# Patient Record
Sex: Female | Born: 1975 | Race: Black or African American | Hispanic: No | Marital: Married | Smoking: Never smoker
Health system: Southern US, Community
[De-identification: ages and names within clinical notes are randomized; demographics above are authoritative.]

## PROBLEM LIST (undated history)

## (undated) DIAGNOSIS — Z973 Presence of spectacles and contact lenses: Secondary | ICD-10-CM

## (undated) DIAGNOSIS — N3941 Urge incontinence: Secondary | ICD-10-CM

## (undated) DIAGNOSIS — K219 Gastro-esophageal reflux disease without esophagitis: Secondary | ICD-10-CM

## (undated) DIAGNOSIS — N920 Excessive and frequent menstruation with regular cycle: Secondary | ICD-10-CM

## (undated) DIAGNOSIS — G935 Compression of brain: Secondary | ICD-10-CM

## (undated) DIAGNOSIS — E785 Hyperlipidemia, unspecified: Secondary | ICD-10-CM

## (undated) DIAGNOSIS — M1990 Unspecified osteoarthritis, unspecified site: Secondary | ICD-10-CM

## (undated) DIAGNOSIS — E039 Hypothyroidism, unspecified: Secondary | ICD-10-CM

## (undated) DIAGNOSIS — N943 Premenstrual tension syndrome: Secondary | ICD-10-CM

## (undated) DIAGNOSIS — E669 Obesity, unspecified: Secondary | ICD-10-CM

## (undated) DIAGNOSIS — E282 Polycystic ovarian syndrome: Secondary | ICD-10-CM

## (undated) DIAGNOSIS — E559 Vitamin D deficiency, unspecified: Secondary | ICD-10-CM

## (undated) DIAGNOSIS — Z862 Personal history of diseases of the blood and blood-forming organs and certain disorders involving the immune mechanism: Secondary | ICD-10-CM

## (undated) DIAGNOSIS — G43909 Migraine, unspecified, not intractable, without status migrainosus: Secondary | ICD-10-CM

## (undated) DIAGNOSIS — L309 Dermatitis, unspecified: Secondary | ICD-10-CM

## (undated) DIAGNOSIS — F419 Anxiety disorder, unspecified: Secondary | ICD-10-CM

## (undated) HISTORY — PX: WISDOM TOOTH EXTRACTION: SHX21

## (undated) HISTORY — DX: Anxiety disorder, unspecified: F41.9

## (undated) HISTORY — DX: Gastro-esophageal reflux disease without esophagitis: K21.9

## (undated) HISTORY — DX: Unspecified osteoarthritis, unspecified site: M19.90

## (undated) HISTORY — DX: Premenstrual tension syndrome: N94.3

---

## 2001-02-20 ENCOUNTER — Ambulatory Visit (HOSPITAL_COMMUNITY): Admission: RE | Admit: 2001-02-20 | Discharge: 2001-02-20 | Payer: Self-pay | Admitting: Internal Medicine

## 2001-07-04 HISTORY — PX: TUBAL LIGATION: SHX77

## 2004-11-04 ENCOUNTER — Ambulatory Visit: Payer: Self-pay | Admitting: Family Medicine

## 2004-11-23 ENCOUNTER — Ambulatory Visit: Payer: Self-pay | Admitting: Family Medicine

## 2004-11-23 ENCOUNTER — Ambulatory Visit (HOSPITAL_COMMUNITY): Admission: RE | Admit: 2004-11-23 | Discharge: 2004-11-23 | Payer: Self-pay | Admitting: Family Medicine

## 2004-12-03 ENCOUNTER — Emergency Department (HOSPITAL_COMMUNITY): Admission: EM | Admit: 2004-12-03 | Discharge: 2004-12-03 | Payer: Self-pay | Admitting: Emergency Medicine

## 2004-12-07 ENCOUNTER — Ambulatory Visit (HOSPITAL_COMMUNITY): Admission: RE | Admit: 2004-12-07 | Discharge: 2004-12-07 | Payer: Self-pay | Admitting: Neurology

## 2005-07-27 ENCOUNTER — Ambulatory Visit: Payer: Self-pay | Admitting: Family Medicine

## 2005-08-01 ENCOUNTER — Ambulatory Visit: Payer: Self-pay | Admitting: Family Medicine

## 2005-08-02 ENCOUNTER — Ambulatory Visit (HOSPITAL_COMMUNITY): Admission: RE | Admit: 2005-08-02 | Discharge: 2005-08-02 | Payer: Self-pay | Admitting: Family Medicine

## 2005-08-29 ENCOUNTER — Ambulatory Visit: Payer: Self-pay | Admitting: Family Medicine

## 2005-09-27 ENCOUNTER — Other Ambulatory Visit: Admission: RE | Admit: 2005-09-27 | Discharge: 2005-09-27 | Payer: Self-pay | Admitting: Obstetrics and Gynecology

## 2006-02-08 ENCOUNTER — Ambulatory Visit: Payer: Self-pay | Admitting: Family Medicine

## 2006-10-03 ENCOUNTER — Ambulatory Visit: Payer: Self-pay | Admitting: Family Medicine

## 2007-04-20 ENCOUNTER — Emergency Department (HOSPITAL_COMMUNITY): Admission: EM | Admit: 2007-04-20 | Discharge: 2007-04-20 | Payer: Self-pay | Admitting: Emergency Medicine

## 2007-10-24 ENCOUNTER — Ambulatory Visit: Payer: Self-pay | Admitting: Family Medicine

## 2007-10-24 LAB — CONVERTED CEMR LAB
BUN: 9 mg/dL (ref 6–23)
Basophils Absolute: 0 10*3/uL (ref 0.0–0.1)
Basophils Relative: 1 % (ref 0–1)
CO2: 23 meq/L (ref 19–32)
Creatinine, Ser: 0.78 mg/dL (ref 0.40–1.20)
Eosinophils Absolute: 0.3 10*3/uL (ref 0.0–0.7)
HDL: 49 mg/dL (ref >39)
LDL Cholesterol: 106 mg/dL — ABNORMAL HIGH (ref 0–99)
Lymphocytes Relative: 37 % (ref 12–46)
Lymphs Abs: 2.1 10*3/uL (ref 0.7–4.0)
MCV: 95.8 fL (ref 78.0–100.0)
Monocytes Absolute: 0.5 10*3/uL (ref 0.1–1.0)
Monocytes Relative: 9 % (ref 3–12)
Neutrophils Relative %: 48 % (ref 43–77)
Potassium: 4.1 meq/L (ref 3.5–5.3)
RBC: 4 M/uL (ref 3.87–5.11)
RDW: 13 % (ref 11.5–15.5)
VLDL: 11 mg/dL (ref 0–40)

## 2007-10-31 DIAGNOSIS — G43909 Migraine, unspecified, not intractable, without status migrainosus: Secondary | ICD-10-CM | POA: Insufficient documentation

## 2008-09-18 ENCOUNTER — Ambulatory Visit: Payer: Self-pay | Admitting: Family Medicine

## 2008-09-18 ENCOUNTER — Telehealth: Payer: Self-pay | Admitting: Family Medicine

## 2008-09-18 DIAGNOSIS — R110 Nausea: Secondary | ICD-10-CM

## 2008-10-08 ENCOUNTER — Encounter: Payer: Self-pay | Admitting: Family Medicine

## 2008-11-26 ENCOUNTER — Encounter: Payer: Self-pay | Admitting: Family Medicine

## 2009-01-02 ENCOUNTER — Encounter: Payer: Self-pay | Admitting: Family Medicine

## 2009-04-20 ENCOUNTER — Telehealth: Payer: Self-pay | Admitting: Family Medicine

## 2009-04-20 ENCOUNTER — Emergency Department (HOSPITAL_COMMUNITY): Admission: EM | Admit: 2009-04-20 | Discharge: 2009-04-20 | Payer: Self-pay | Admitting: Emergency Medicine

## 2009-04-21 ENCOUNTER — Ambulatory Visit: Payer: Self-pay | Admitting: Family Medicine

## 2009-04-21 DIAGNOSIS — R209 Unspecified disturbances of skin sensation: Secondary | ICD-10-CM

## 2009-04-28 ENCOUNTER — Telehealth (INDEPENDENT_AMBULATORY_CARE_PROVIDER_SITE_OTHER): Payer: Self-pay | Admitting: *Deleted

## 2009-06-11 ENCOUNTER — Encounter: Payer: Self-pay | Admitting: Family Medicine

## 2009-06-23 ENCOUNTER — Ambulatory Visit (HOSPITAL_COMMUNITY): Admission: RE | Admit: 2009-06-23 | Discharge: 2009-06-23 | Payer: Self-pay | Admitting: Neurology

## 2009-09-15 ENCOUNTER — Encounter: Payer: Self-pay | Admitting: Family Medicine

## 2009-10-01 ENCOUNTER — Telehealth: Payer: Self-pay | Admitting: Family Medicine

## 2010-08-03 NOTE — Progress Notes (Signed)
Summary: [facility]   Imported By: Lind Guest 09/15/2009 10:18:19  _____________________________________________________________________  External Attachment:    Type:   Image     Comment:   External Document

## 2010-08-03 NOTE — Progress Notes (Signed)
Summary: rx  Phone Note Call from Patient   Summary of Call: would like to get a rx called into [facility] for congestion. [phone] work [phone] ext [phone] Initial call taken by: Rudene Anda,  October 01, 2009 3:08 PM  Follow-up for Phone Call        nasal congestion, sneezing, stopped up, cough no fever, no green drainage, x 3 days states mucinex not helping advised appt tomorrow but patient declined Follow-up by: Adella Hare LPN,  October 01, 2009 3:21 PM  Additional Follow-up for Phone Call Additional follow up Details #1::        suggest otc ocean spray, use of claritin or zyrtec also,  Additional Follow-up by: Syliva Overman MD,  October 01, 2009 5:09 PM    Additional Follow-up for Phone Call Additional follow up Details #2::    patient aware Follow-up by: Adella Hare LPN,  October 01, 2009 5:27 PM

## 2016-10-05 ENCOUNTER — Ambulatory Visit: Payer: Self-pay | Admitting: Family Medicine

## 2016-10-11 ENCOUNTER — Encounter: Payer: Self-pay | Admitting: Family Medicine

## 2016-10-11 ENCOUNTER — Ambulatory Visit (INDEPENDENT_AMBULATORY_CARE_PROVIDER_SITE_OTHER): Payer: Self-pay | Admitting: Family Medicine

## 2016-10-11 VITALS — BP 112/68 | HR 76 | Temp 98.2°F | Resp 16 | Ht 61.0 in | Wt 230.0 lb

## 2016-10-11 DIAGNOSIS — L309 Dermatitis, unspecified: Secondary | ICD-10-CM

## 2016-10-11 DIAGNOSIS — R55 Syncope and collapse: Secondary | ICD-10-CM

## 2016-10-11 DIAGNOSIS — Z91018 Allergy to other foods: Secondary | ICD-10-CM

## 2016-10-11 DIAGNOSIS — N943 Premenstrual tension syndrome: Secondary | ICD-10-CM

## 2016-10-11 DIAGNOSIS — Z7689 Persons encountering health services in other specified circumstances: Secondary | ICD-10-CM

## 2016-10-11 HISTORY — DX: Premenstrual tension syndrome: N94.3

## 2016-10-11 LAB — URINALYSIS, ROUTINE W REFLEX MICROSCOPIC
BILIRUBIN URINE: NEGATIVE
Glucose, UA: NEGATIVE
HGB URINE DIPSTICK: NEGATIVE
KETONES UR: NEGATIVE
Leukocytes, UA: NEGATIVE
NITRITE: NEGATIVE
PROTEIN: NEGATIVE
Specific Gravity, Urine: 1.026 (ref 1.001–1.035)
pH: 6.5 (ref 5.0–8.0)

## 2016-10-11 LAB — TSH: TSH: 2.09 m[IU]/L

## 2016-10-11 LAB — COMPREHENSIVE METABOLIC PANEL
ALBUMIN: 4.2 g/dL (ref 3.6–5.1)
ALT: 8 U/L (ref 6–29)
AST: 13 U/L (ref 10–30)
Alkaline Phosphatase: 135 U/L — ABNORMAL HIGH (ref 33–115)
BILIRUBIN TOTAL: 0.6 mg/dL (ref 0.2–1.2)
BUN: 9 mg/dL (ref 7–25)
CHLORIDE: 103 mmol/L (ref 98–110)
CO2: 30 mmol/L (ref 20–31)
CREATININE: 0.88 mg/dL (ref 0.50–1.10)
Calcium: 9.3 mg/dL (ref 8.6–10.2)
GLUCOSE: 90 mg/dL (ref 65–99)
Potassium: 4.3 mmol/L (ref 3.5–5.3)
SODIUM: 139 mmol/L (ref 135–146)
Total Protein: 7 g/dL (ref 6.1–8.1)

## 2016-10-11 LAB — CBC
HCT: 37.9 % (ref 35.0–45.0)
HEMOGLOBIN: 12.4 g/dL (ref 11.7–15.5)
MCH: 30.1 pg (ref 27.0–33.0)
MCHC: 32.7 g/dL (ref 32.0–36.0)
MCV: 92 fL (ref 80.0–100.0)
MPV: 9 fL (ref 7.5–12.5)
PLATELETS: 390 10*3/uL (ref 140–400)
RBC: 4.12 MIL/uL (ref 3.80–5.10)
RDW: 14 % (ref 11.0–15.0)
WBC: 7.3 10*3/uL (ref 3.8–10.8)

## 2016-10-11 LAB — LIPID PANEL
Cholesterol: 184 mg/dL (ref <200)
HDL: 43 mg/dL — ABNORMAL LOW (ref >50)
LDL CALC: 128 mg/dL — AB (ref <100)
TRIGLYCERIDES: 64 mg/dL (ref <150)
Total CHOL/HDL Ratio: 4.3 Ratio (ref <5.0)
VLDL: 13 mg/dL (ref <30)

## 2016-10-11 NOTE — Patient Instructions (Addendum)
Need blood work today I will send you a letter with your test results.  If there is anything of concern, we will call right away.  Try to walk every day that you are able Drink more water!  Heart healthy diet advised  See me in a month.      DASH Eating Plan DASH stands for "Dietary Approaches to Stop Hypertension." The DASH eating plan is a healthy eating plan that has been shown to reduce high blood pressure (hypertension). It may also reduce your risk for type 2 diabetes, heart disease, and stroke. The DASH eating plan may also help with weight loss. What are tips for following this plan? General guidelines   Avoid eating more than 2,300 mg (milligrams) of salt (sodium) a day. If you have hypertension, you may need to reduce your sodium intake to 1,500 mg a day.  Limit alcohol intake to no more than 1 drink a day for nonpregnant women and 2 drinks a day for men. One drink equals 12 oz of beer, 5 oz of wine, or 1 oz of hard liquor.  Work with your health care provider to maintain a healthy body weight or to lose weight. Ask what an ideal weight is for you.  Get at least 30 minutes of exercise that causes your heart to beat faster (aerobic exercise) most days of the week. Activities may include walking, swimming, or biking.  Work with your health care provider or diet and nutrition specialist (dietitian) to adjust your eating plan to your individual calorie needs. Reading food labels   Check food labels for the amount of sodium per serving. Choose foods with less than 5 percent of the Daily Value of sodium. Generally, foods with less than 300 mg of sodium per serving fit into this eating plan.  To find whole grains, look for the word "whole" as the first word in the ingredient list. Shopping   Buy products labeled as "low-sodium" or "no salt added."  Buy fresh foods. Avoid canned foods and premade or frozen meals. Cooking   Avoid adding salt when cooking. Use salt-free  seasonings or herbs instead of table salt or sea salt. Check with your health care provider or pharmacist before using salt substitutes.  Do not fry foods. Cook foods using healthy methods such as baking, boiling, grilling, and broiling instead.  Cook with heart-healthy oils, such as olive, canola, soybean, or sunflower oil. Meal planning    Eat a balanced diet that includes:  5 or more servings of fruits and vegetables each day. At each meal, try to fill half of your plate with fruits and vegetables.  Up to 6-8 servings of whole grains each day.  Less than 6 oz of lean meat, poultry, or fish each day. A 3-oz serving of meat is about the same size as a deck of cards. One egg equals 1 oz.  2 servings of low-fat dairy each day.  A serving of nuts, seeds, or beans 5 times each week.  Heart-healthy fats. Healthy fats called Omega-3 fatty acids are found in foods such as flaxseeds and coldwater fish, like sardines, salmon, and mackerel.  Limit how much you eat of the following:  Canned or prepackaged foods.  Food that is high in trans fat, such as fried foods.  Food that is high in saturated fat, such as fatty meat.  Sweets, desserts, sugary drinks, and other foods with added sugar.  Full-fat dairy products.  Do not salt foods before eating.  Try  to eat at least 2 vegetarian meals each week.  Eat more home-cooked food and less restaurant, buffet, and fast food.  When eating at a restaurant, ask that your food be prepared with less salt or no salt, if possible. What foods are recommended? The items listed may not be a complete list. Talk with your dietitian about what dietary choices are best for you. Grains  Whole-grain or whole-wheat bread. Whole-grain or whole-wheat pasta. Brown rice. Orpah Cobb. Bulgur. Whole-grain and low-sodium cereals. Pita bread. Low-fat, low-sodium crackers. Whole-wheat flour tortillas. Vegetables  Fresh or frozen vegetables (raw, steamed,  roasted, or grilled). Low-sodium or reduced-sodium tomato and vegetable juice. Low-sodium or reduced-sodium tomato sauce and tomato paste. Low-sodium or reduced-sodium canned vegetables. Fruits  All fresh, dried, or frozen fruit. Canned fruit in natural juice (without added sugar). Meat and other protein foods  Skinless chicken or [place]. Ground chicken or [place]. Pork with fat trimmed off. Fish and seafood. Egg whites. Dried beans, peas, or lentils. Unsalted nuts, nut butters, and seeds. Unsalted canned beans. Lean cuts of beef with fat trimmed off. Low-sodium, lean deli meat. Dairy  Low-fat (1%) or fat-free (skim) milk. Fat-free, low-fat, or reduced-fat cheeses. Nonfat, low-sodium ricotta or cottage cheese. Low-fat or nonfat yogurt. Low-fat, low-sodium cheese. Fats and oils  Soft margarine without trans fats. Vegetable oil. Low-fat, reduced-fat, or light mayonnaise and salad dressings (reduced-sodium). Canola, safflower, olive, soybean, and sunflower oils. Avocado. Seasoning and other foods  Herbs. Spices. Seasoning mixes without salt. Unsalted popcorn and pretzels. Fat-free sweets. What foods are not recommended? The items listed may not be a complete list. Talk with your dietitian about what dietary choices are best for you. Grains  Baked goods made with fat, such as croissants, muffins, or some breads. Dry pasta or rice meal packs. Vegetables  Creamed or fried vegetables. Vegetables in a cheese sauce. Regular canned vegetables (not low-sodium or reduced-sodium). Regular canned tomato sauce and paste (not low-sodium or reduced-sodium). Regular tomato and vegetable juice (not low-sodium or reduced-sodium). Rosita Fire. Olives. Fruits  Canned fruit in a light or heavy syrup. Fried fruit. Fruit in cream or butter sauce. Meat and other protein foods  Fatty cuts of meat. Ribs. Fried meat. Tomasa Blase. Sausage. Bologna and other processed lunch meats. Salami. Fatback. Hotdogs. Bratwurst. Salted nuts and  seeds. Canned beans with added salt. Canned or smoked fish. Whole eggs or egg yolks. Chicken or [place] with skin. Dairy  Whole or 2% milk, cream, and half-and-half. Whole or full-fat cream cheese. Whole-fat or sweetened yogurt. Full-fat cheese. Nondairy creamers. Whipped toppings. Processed cheese and cheese spreads. Fats and oils  Butter. Stick margarine. Lard. Shortening. Ghee. Bacon fat. Tropical oils, such as coconut, palm kernel, or palm oil. Seasoning and other foods  Salted popcorn and pretzels. Onion salt, garlic salt, seasoned salt, table salt, and sea salt. Worcestershire sauce. Tartar sauce. Barbecue sauce. Teriyaki sauce. Soy sauce, including reduced-sodium. Steak sauce. Canned and packaged gravies. Fish sauce. Oyster sauce. Cocktail sauce. Horseradish that you find on the shelf. Ketchup. Mustard. Meat flavorings and tenderizers. Bouillon cubes. Hot sauce and Tabasco sauce. Premade or packaged marinades. Premade or packaged taco seasonings. Relishes. Regular salad dressings. Where to find more information:  National Heart, Lung, and Blood Institute: PopSteam.is  American Heart Association: www.heart.org Summary  The DASH eating plan is a healthy eating plan that has been shown to reduce high blood pressure (hypertension). It may also reduce your risk for type 2 diabetes, heart disease, and stroke.  With the DASH eating  plan, you should limit salt (sodium) intake to 2,300 mg a day. If you have hypertension, you may need to reduce your sodium intake to 1,500 mg a day.  When on the DASH eating plan, aim to eat more fresh fruits and vegetables, whole grains, lean proteins, low-fat dairy, and heart-healthy fats.  Work with your health care provider or diet and nutrition specialist (dietitian) to adjust your eating plan to your individual calorie needs. This information is not intended to replace advice given to you by your health care provider. Make sure you discuss any questions  you have with your health care provider. Document Released: 06/09/2011 Document Revised: 06/13/2016 Document Reviewed: 06/13/2016 Elsevier Interactive Patient Education  2017 [employer].

## 2016-10-11 NOTE — Progress Notes (Signed)
Chief Complaint  Patient presents with  . Establish Care   Patient is new to establish. No old records are available. She states her GYN care is up-to-date with regular Pap smears and a recent mammogram. She is not up-to-date with immunizations. She did not get a flu shot this year. She is due for tetanus shot. She does go for regular dental visits and I visits. She tries to exercise and eat well. She has no medical complaints today, except for a vague feeling that she can't lose weight and is more tired. She does complain of a couple episodes in [place] where she felt like she had to sit down or she might faint. No palpitations. These spells have gone away.   Patient Active Problem List   Diagnosis Date Noted  . PMS (premenstrual syndrome) 10/11/2016  . Food allergy 10/11/2016  . Eczema 10/11/2016  . OBESITY 10/31/2007  . MIGRAINE HEADACHE 10/31/2007    Outpatient Encounter Prescriptions as of 10/11/2016  Medication Sig  . clobetasol ointment (TEMOVATE) 0.05 % Apply 1 application topically as needed.  Marland Kitchen FLUoxetine (PROZAC) 10 MG tablet Take 10 mg by mouth daily. Week prior to period.   No facility-administered encounter medications on file as of 10/11/2016.     Past Medical History:  Diagnosis Date  . Anxiety   . Arthritis    hip  . GERD (gastroesophageal reflux disease)   . PMS (premenstrual syndrome) 10/11/2016    Past Surgical History:  Procedure Laterality Date  . TUBAL LIGATION  2003    Social History   Social History  . Marital status: Married    Spouse name: Lyda Perone  . Number of children: 3  . Years of education: 14   Occupational History  . stay at home     husband is retired   Social History Main Topics  . Smoking status: Never Smoker  . Smokeless tobacco: Never Used  . Alcohol use Yes     Comment: occasionally  . Drug use: No  . Sexual activity: Yes    Birth control/ protection: Surgical   Other Topics Concern  . 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 Hyperlipidemia Mother   . Hypertension Mother   . Arthritis Father   . Cancer Father     lymphoma  . Heart disease Maternal Grandmother     CHF  . Hyperlipidemia Maternal Grandmother   . Hypertension Maternal Grandmother   . Diabetes Maternal Grandmother   . Cancer Paternal Grandmother     ovarian  . Kidney disease Paternal Grandfather   . Alcohol abuse Paternal Grandfather     Review of Systems  Constitutional: Negative for chills, fever and weight loss.  HENT: Negative for congestion and hearing loss.   Eyes: Negative for blurred vision and pain.  Respiratory: Negative for cough and shortness of breath.   Cardiovascular: Negative for chest pain and leg swelling.  Gastrointestinal: Negative for abdominal pain, constipation, diarrhea and heartburn.  Genitourinary: Negative for dysuria and frequency.  Musculoskeletal: Negative for falls, joint pain and myalgias.  Neurological: Negative for dizziness, seizures and headaches.       Lightheadedness  Psychiatric/Behavioral: Negative for depression. The patient is not nervous/anxious and does  not have insomnia.     BP 112/68 (BP Location: Right Arm, Patient Position: Sitting, Cuff Size: Large)   Pulse 76   Temp 98.2 F (36.8 C) (Temporal)   Resp 16   Ht  (1.549 m)   Wt 230 lb 0.6 oz (104.3 kg)   LMP 09/27/2016 (Exact Date)   SpO2 97%   BMI 43.47 kg/m   Physical Exam  Constitutional: She is oriented to person, place, and time. She appears well-developed and well-nourished.  HENT:  Head: Normocephalic and atraumatic.  Right Ear: External ear normal.  Left Ear: External ear normal.  Mouth/Throat: Oropharynx is clear and moist.  Eyes: Conjunctivae are normal. Pupils are equal, round, and reactive to  light.  Neck: Normal range of motion. Neck supple. No thyromegaly present.  Cardiovascular: Normal rate, regular rhythm and normal heart sounds.   Blood pressure right arm supine, large cuff, 120/68. Seated 130/70. Pulse unchanged  Pulmonary/Chest: Effort normal and breath sounds normal. No respiratory distress.  Abdominal: Soft. Bowel sounds are normal.  Musculoskeletal: Normal range of motion. She exhibits no edema.  Lymphadenopathy:    She has no cervical adenopathy.  Neurological: She is alert and oriented to person, place, and time. She displays normal reflexes.  Gait normal  Skin: Skin is warm and dry.  Psychiatric: She has a normal mood and affect. Her behavior is normal. Thought content normal.  Nursing note and vitals reviewed.   1. PMS (premenstrual syndrome)  2. Food allergy  3. Eczema, unspecified type  4. Pre-syncope - CBC - Comprehensive metabolic panel - Lipid panel - Hemoglobin A1c - Urinalysis, Routine w reflex microscopic - TSH 5. Morbid obesity We discussed the health risks associated with her weight. We discussed the importance of a heart healthy diet, limiting her portions, limiting her fats. I recommend that she exercise 30 minutes a day at least 5 days a week.  Patient Instructions  Need blood work today I will send you a letter with your test results.  If there is anything of concern, we will call right away.  Try to walk every day that you are able Drink more water!  Heart healthy diet advised  See me in a month.      DASH Eating Plan DASH stands for "Dietary Approaches to Stop Hypertension." The DASH eating plan is a healthy eating plan that has been shown to reduce high blood pressure (hypertension). It may also reduce your risk for type 2 diabetes, heart disease, and stroke. The DASH eating plan may also help with weight loss. What are tips for following this plan? General guidelines   Avoid eating more than 2,300 mg (milligrams) of salt  (sodium) a day. If you have hypertension, you may need to reduce your sodium intake to 1,500 mg a day.  Limit alcohol intake to no more than 1 drink a day for nonpregnant women and 2 drinks a day for men. One drink equals 12 oz of beer, 5 oz of wine, or 1 oz of hard liquor.  Work with your health care provider to maintain a healthy body weight or to lose weight. Ask what an ideal weight is for you.  Get at least 30 minutes of exercise that causes your heart to beat faster (aerobic exercise) most days of the week. Activities may include walking, swimming, or biking.  Work with your health care provider or diet and nutrition specialist (dietitian) to adjust your eating plan to your individual calorie needs. Reading food labels  Check food labels for the amount of sodium per serving. Choose foods with less than 5 percent of the Daily Value of sodium. Generally, foods with less than 300 mg of sodium per serving fit into this eating plan.  To find whole grains, look for the word "whole" as the first word in the ingredient list. Shopping   Buy products labeled as "low-sodium" or "no salt added."  Buy fresh foods. Avoid canned foods and premade or frozen meals. Cooking   Avoid adding salt when cooking. Use salt-free seasonings or herbs instead of table salt or sea salt. Check with your health care provider or pharmacist before using salt substitutes.  Do not fry foods. Cook foods using healthy methods such as baking, boiling, grilling, and broiling instead.  Cook with heart-healthy oils, such as olive, canola, soybean, or sunflower oil. Meal planning    Eat a balanced diet that includes:  5 or more servings of fruits and vegetables each day. At each meal, try to fill half of your plate with fruits and vegetables.  Up to 6-8 servings of whole grains each day.  Less than 6 oz of lean meat, poultry, or fish each day. A 3-oz serving of meat is about the same size as a deck of cards. One egg  equals 1 oz.  2 servings of low-fat dairy each day.  A serving of nuts, seeds, or beans 5 times each week.  Heart-healthy fats. Healthy fats called Omega-3 fatty acids are found in foods such as flaxseeds and coldwater fish, like sardines, salmon, and mackerel.  Limit how much you eat of the following:  Canned or prepackaged foods.  Food that is high in trans fat, such as fried foods.  Food that is high in saturated fat, such as fatty meat.  Sweets, desserts, sugary drinks, and other foods with added sugar.  Full-fat dairy products.  Do not salt foods before eating.  Try to eat at least 2 vegetarian meals each week.  Eat more home-cooked food and less restaurant, buffet, and fast food.  When eating at a restaurant, ask that your food be prepared with less salt or no salt, if possible. What foods are recommended? The items listed may not be a complete list. Talk with your dietitian about what dietary choices are best for you. Grains  Whole-grain or whole-wheat bread. Whole-grain or whole-wheat pasta. Brown rice. Orpah Cobb. Bulgur. Whole-grain and low-sodium cereals. Pita bread. Low-fat, low-sodium crackers. Whole-wheat flour tortillas. Vegetables  Fresh or frozen vegetables (raw, steamed, roasted, or grilled). Low-sodium or reduced-sodium tomato and vegetable juice. Low-sodium or reduced-sodium tomato sauce and tomato paste. Low-sodium or reduced-sodium canned vegetables. Fruits  All fresh, dried, or frozen fruit. Canned fruit in natural juice (without added sugar). Meat and other protein foods  Skinless chicken or [place]. Ground chicken or [place]. Pork with fat trimmed off. Fish and seafood. Egg whites. Dried beans, peas, or lentils. Unsalted nuts, nut butters, and seeds. Unsalted canned beans. Lean cuts of beef with fat trimmed off. Low-sodium, lean deli meat. Dairy  Low-fat (1%) or fat-free (skim) milk. Fat-free, low-fat, or reduced-fat cheeses. Nonfat, low-sodium  ricotta or cottage cheese. Low-fat or nonfat yogurt. Low-fat, low-sodium cheese. Fats and oils  Soft margarine without trans fats. Vegetable oil. Low-fat, reduced-fat, or light mayonnaise and salad dressings (reduced-sodium). Canola, safflower, olive, soybean, and sunflower oils. Avocado. Seasoning and other foods  Herbs. Spices. Seasoning mixes without salt. Unsalted popcorn and pretzels. Fat-free sweets. What foods are not recommended? The items listed  may not be a complete list. Talk with your dietitian about what dietary choices are best for you. Grains  Baked goods made with fat, such as croissants, muffins, or some breads. Dry pasta or rice meal packs. Vegetables  Creamed or fried vegetables. Vegetables in a cheese sauce. Regular canned vegetables (not low-sodium or reduced-sodium). Regular canned tomato sauce and paste (not low-sodium or reduced-sodium). Regular tomato and vegetable juice (not low-sodium or reduced-sodium). Rosita Fire. Olives. Fruits  Canned fruit in a light or heavy syrup. Fried fruit. Fruit in cream or butter sauce. Meat and other protein foods  Fatty cuts of meat. Ribs. Fried meat. Tomasa Blase. Sausage. Bologna and other processed lunch meats. Salami. Fatback. Hotdogs. Bratwurst. Salted nuts and seeds. Canned beans with added salt. Canned or smoked fish. Whole eggs or egg yolks. Chicken or [place] with skin. Dairy  Whole or 2% milk, cream, and half-and-half. Whole or full-fat cream cheese. Whole-fat or sweetened yogurt. Full-fat cheese. Nondairy creamers. Whipped toppings. Processed cheese and cheese spreads. Fats and oils  Butter. Stick margarine. Lard. Shortening. Ghee. Bacon fat. Tropical oils, such as coconut, palm kernel, or palm oil. Seasoning and other foods  Salted popcorn and pretzels. Onion salt, garlic salt, seasoned salt, table salt, and sea salt. Worcestershire sauce. Tartar sauce. Barbecue sauce. Teriyaki sauce. Soy sauce, including reduced-sodium. Steak sauce.  Canned and packaged gravies. Fish sauce. Oyster sauce. Cocktail sauce. Horseradish that you find on the shelf. Ketchup. Mustard. Meat flavorings and tenderizers. Bouillon cubes. Hot sauce and Tabasco sauce. Premade or packaged marinades. Premade or packaged taco seasonings. Relishes. Regular salad dressings. Where to find more information:  National Heart, Lung, and Blood Institute: PopSteam.is  American Heart Association: www.heart.org Summary  The DASH eating plan is a healthy eating plan that has been shown to reduce high blood pressure (hypertension). It may also reduce your risk for type 2 diabetes, heart disease, and stroke.  With the DASH eating plan, you should limit salt (sodium) intake to 2,300 mg a day. If you have hypertension, you may need to reduce your sodium intake to 1,500 mg a day.  When on the DASH eating plan, aim to eat more fresh fruits and vegetables, whole grains, lean proteins, low-fat dairy, and heart-healthy fats.  Work with your health care provider or diet and nutrition specialist (dietitian) to adjust your eating plan to your individual calorie needs. This information is not intended to replace advice given to you by your health care provider. Make sure you discuss any questions you have with your health care provider. Document Released: 06/09/2011 Document Revised: 06/13/2016 Document Reviewed: 06/13/2016 Elsevier Interactive Patient Education  2017 [employer] Inc.    Eustace Moore, MD

## 2016-10-12 LAB — HEMOGLOBIN A1C
HEMOGLOBIN A1C: 4.9 % (ref <5.7)
MEAN PLASMA GLUCOSE: 94 mg/dL

## 2016-11-10 ENCOUNTER — Ambulatory Visit: Payer: Self-pay | Admitting: Family Medicine

## 2016-11-14 ENCOUNTER — Encounter: Payer: Self-pay | Admitting: Family Medicine

## 2016-11-14 ENCOUNTER — Telehealth: Payer: Self-pay

## 2016-11-15 NOTE — Telephone Encounter (Signed)
Pt has appt 5 [phone]

## 2016-11-18 ENCOUNTER — Encounter: Payer: Self-pay | Admitting: Family Medicine

## 2016-11-18 ENCOUNTER — Ambulatory Visit (INDEPENDENT_AMBULATORY_CARE_PROVIDER_SITE_OTHER): Admitting: Family Medicine

## 2016-11-18 VITALS — BP 118/78 | HR 72 | Temp 97.8°F | Resp 16 | Ht 61.0 in | Wt 232.0 lb

## 2016-11-18 DIAGNOSIS — N943 Premenstrual tension syndrome: Secondary | ICD-10-CM | POA: Diagnosis not present

## 2016-11-18 MED ORDER — FLUOXETINE HCL 20 MG PO TABS: 20.0000 mg | ORAL_TABLET | Freq: Every day | ORAL | 3 refills | Status: DC

## 2016-11-18 NOTE — Patient Instructions (Signed)
Walk every day that you are able Increase the fluoxetine to 20 mg a day Try taking in the morning  See me yearly Come sooner for problems

## 2016-11-18 NOTE — Progress Notes (Signed)
    Chief Complaint  Patient presents with  . Follow-up   Patient is here for follow-up. She is feeling well. She was started on fluoxetine 10 mg a day. This is been somewhat helpful for her. She is having fewer headaches. Her stress level is improved. Going to increase it to 20 mg a day for better therapeutic response. No side effects. Her only complaint is difficulty sleeping. She is difficulty falling asleep. We discussed sleep hygiene measures. Avoiding caffeine. Regular exercise. Relaxation exercises. Her blood work is reviewed with her. Everything was normal or as expected.  She has a mildly high cholesterol, but her Framingham risk for 10 year developing heart disease is less than 3%. Reasonably low-cholesterol diet And regular exercise are recommended.  Patient Active Problem List   Diagnosis Date Noted  . PMS (premenstrual syndrome) 10/11/2016  . Food allergy 10/11/2016  . Eczema 10/11/2016  . Morbid obesity (HCC) 10/31/2007  . MIGRAINE HEADACHE 10/31/2007    Outpatient Encounter Prescriptions as of 11/18/2016  Medication Sig  . clobetasol ointment (TEMOVATE) 0.05 % Apply 1 application topically as needed.  Marland Kitchen. FLUoxetine (PROZAC) 20 MG tablet Take 1 tablet (20 mg total) by mouth daily. Week prior to period.  . [DISCONTINUED] FLUoxetine (PROZAC) 10 MG tablet Take 10 mg by mouth daily. Week prior to period.   No facility-administered encounter medications on file as of 11/18/2016.     No Known Allergies  Review of Systems  Constitutional: Negative for activity change, appetite change and unexpected weight change.  HENT: Negative for congestion, dental problem, postnasal drip and rhinorrhea.   Eyes: Negative for redness and visual disturbance.  Respiratory: Negative for cough and shortness of breath.   Cardiovascular: Negative for chest pain, palpitations and leg swelling.  Gastrointestinal: Negative for abdominal pain, constipation and diarrhea.  Genitourinary: Negative for  difficulty urinating, frequency and menstrual problem.  Musculoskeletal: Negative for arthralgias and back pain.  Neurological: Negative for dizziness and headaches.  Psychiatric/Behavioral: Negative for dysphoric mood and sleep disturbance. The patient is not nervous/anxious.      BP 118/78 (BP Location: Right Arm, Patient Position: Sitting, Cuff Size: Normal)   Pulse 72   Temp 97.8 F (36.6 C) (Temporal)   Resp 16   Ht 5\' 1"  (1.549 m)   Wt 232 lb (105.2 kg)   LMP 11/04/2016 (Exact Date)   SpO2 98%   BMI 43.84 kg/m   Physical Exam  Constitutional: She is oriented to person, place, and time. She appears well-developed and well-nourished. No distress.  Overweight  HENT:  Head: Normocephalic and atraumatic.  Mouth/Throat: Oropharynx is clear and moist.  Eyes: Conjunctivae are normal. Pupils are equal, round, and reactive to light.  Neck: Normal range of motion. Neck supple.  Lymphadenopathy:    She has no cervical adenopathy.  Neurological: She is alert and oriented to person, place, and time.  Skin: Skin is warm and dry.  New tattoo right foot  Psychiatric: She has a normal mood and affect. Her behavior is normal. Thought content normal.    ASSESSMENT/PLAN:  1. PMS (premenstrual syndrome) Increase fluoxetine   Patient Instructions  Walk every day that you are able Increase the fluoxetine to 20 mg a day Try taking in the morning  See me yearly Come sooner for problems   Eustace MooreYvonne Sue Jaysiah Marchetta, MD

## 2017-01-09 ENCOUNTER — Encounter: Payer: Self-pay | Admitting: Family Medicine

## 2017-01-10 ENCOUNTER — Other Ambulatory Visit: Payer: Self-pay | Admitting: Family Medicine

## 2017-01-16 ENCOUNTER — Encounter: Payer: Self-pay | Admitting: Family Medicine

## 2017-01-17 ENCOUNTER — Telehealth: Payer: Self-pay

## 2017-01-17 ENCOUNTER — Encounter: Payer: Self-pay | Admitting: Family Medicine

## 2017-01-27 NOTE — Telephone Encounter (Signed)
Nutritional referral done.

## 2017-02-14 ENCOUNTER — Encounter: Attending: Family Medicine | Admitting: Nutrition

## 2017-02-14 NOTE — Patient Instructions (Signed)
Goals 1. Follow Plate Method 2. Cut out sweets. 3.  Roe CoombsDon' skip meals. 4. Drink water only. Walk 30 minutes 4 a week. Lose 1-2 lbs per week.

## 2017-02-14 NOTE — Progress Notes (Signed)
Medical Nutrition Therapy:  Appt start time: 1330 end time:  1430.   Assessment:  Primary concerns today: Obesity. PMH: GERD, Anxiety, Arthritis and PMS. LIves with her husband and 3 children. 10, 17, 15. She does the shopping and cooks mostly grill and baked. Eats out 4-6 times per week.  Admits to eating out procesed foods, eats sweets and not drinking water. Physical activity;  Just joined [facility] and willing to get started weekly.Marland Kitchen  Her husband is physically active playing golf.    Most she has weighed  238 lbs today.Marland Kitchen  Desired weight is 170-180 lbs.  She has lost weight in past while taking Topamax for her migraines but regained it after stopping Topamax. Elevated LDL, low HDL and elevated Alk Phos noted. She eats 1 meal per day Diet is inconsistent to meet her needs and excessive causing weight gain.  She seems highly motivated to make lifestyle changes with changing food choices, being more active and improving her health.   Lab Results  Component Value Date   HGBA1C 4.9 10/11/2016   Lipid Panel     Component Value Date/Time   CHOL 184 10/11/2016 0906   TRIG 64 10/11/2016 0906   HDL 43 (L) 10/11/2016 0906   CHOLHDL 4.3 10/11/2016 0906   VLDL 13 10/11/2016 0906   LDLCALC 128 (H) 10/11/2016 0906   CMP Latest Ref Rng & Units 10/11/2016 10/24/2007  Glucose 65 - 99 mg/dL 90 84  BUN 7 - 25 mg/dL 9 9  Creatinine 0.50 - 1.10 mg/dL 0.88 0.78  Sodium 135 - 146 mmol/L 139 138  Potassium 3.5 - 5.3 mmol/L 4.3 4.1  Chloride 98 - 110 mmol/L 103 106  CO2 20 - 31 mmol/L 30 23  Calcium 8.6 - 10.2 mg/dL 9.3 9.4  Total Protein 6.1 - 8.1 g/dL 7.0 -  Total Bilirubin 0.2 - 1.2 mg/dL 0.6 -  Alkaline Phos 33 - 115 U/L 135(H) -  AST 10 - 30 U/L 13 -  ALT 6 - 29 U/L 8 -      Preferred Learning Style:   No preference indicated   Learning Readiness:   Ready  Change in progress   MEDICATIONS:   DIETARY INTAKE:  24-hr recall:  B ( AM): skipped yesterday:  Or greek yogurt with granola  and cappichino or coffee   Snk ( AM):  L ( PM): skipped: fast foods if she is eats lunch Snk ( PM): 330: Ham, chicken, green beans, corn, potato salad, roll, Soda D ( PM):  Typically, meat and vegetables or salad. Snk ( PM): Beverages: Water, sodas, juice .  Eats out [place], [place] and pizza on weekends. Usual physical activity: ADL but going to join gym.  Estimated energy needs: 1200  calories 135 g carbohydrates 90 g protein 33 g fat  Progress Towards Goal(s):  In progress.   Nutritional Diagnosis:  NI-1.5 Excessive energy intake As related to high fat high sugar diet.  As evidenced by BMI > 40..    Intervention:  Healthy Weight loss tips, My Plate, HIgh Fiber, Low Fat Diet, need for lots of fresh fruis and vegetables,  portion control, timing of meals, drinking water, reading food labels, emotional eating and tracking food with food journal and benefits of 150 minutes of exercise weekly.   Goals 1. Follow Plate Method 2. Cut out sweets. 3.  Timmothy Sours' skip meals. 4. Drink water only. Walk 30 minutes 4 a week. Lose 1-2 lbs per week.  Teaching Method Utilized:  Ship broker  Hands on  Handouts given during visit include:  The Plate Method   Meal Plan Card  Emotional Eating Barriers to learning/adherence to lifestyle change:  none  Demonstrated degree of understanding via:  Teach Back   Monitoring/Evaluation:  Dietary intake, exercise, meal planing, food journal and body weight in 1 month(s).

## 2017-03-20 ENCOUNTER — Ambulatory Visit: Admitting: Nutrition

## 2017-09-01 ENCOUNTER — Encounter: Payer: Self-pay | Admitting: Family Medicine

## 2017-09-04 ENCOUNTER — Telehealth: Payer: Self-pay

## 2017-09-04 NOTE — Telephone Encounter (Signed)
Called and notified patient we received mychart message, notified her that we needed her to be seen, scheduled patient for tomorrow. No other questions or concerns.

## 2017-09-05 ENCOUNTER — Ambulatory Visit (INDEPENDENT_AMBULATORY_CARE_PROVIDER_SITE_OTHER): Admitting: Family Medicine

## 2017-09-05 ENCOUNTER — Encounter: Payer: Self-pay | Admitting: Family Medicine

## 2017-09-05 VITALS — BP 112/72 | HR 79 | Temp 97.4°F | Ht 61.0 in | Wt 238.2 lb

## 2017-09-05 DIAGNOSIS — J0100 Acute maxillary sinusitis, unspecified: Secondary | ICD-10-CM

## 2017-09-05 MED ORDER — FLUTICASONE PROPIONATE 50 MCG/ACT NA SUSP: 2.0000 | Freq: Every day | NASAL | 2 refills | Status: DC

## 2017-09-05 MED ORDER — AMOXICILLIN-POT CLAVULANATE 875-125 MG PO TABS: 1.0000 | ORAL_TABLET | Freq: Two times a day (BID) | ORAL | 0 refills | Status: DC

## 2017-09-05 NOTE — Progress Notes (Signed)
Chief Complaint  Patient presents with  . URI    congestion, cough, sore throat, denies fever, duration-2 weeks   Patient is here for an upper respiratory infection.  She is been sick for 2 weeks.  She has some cough but no real sputum.  Most of her symptoms are sinus pressure and pain, postnasal drip, nasal congestion, yellow-green nasal drainage, and sore throat.  She has pressure and pain in her right ear.  No fever or chills.  No malaise or body aches.  No exposure to strep or pneumonia or influenza.  Patient Active Problem List   Diagnosis Date Noted  . PMS (premenstrual syndrome) 10/11/2016  . Food allergy 10/11/2016  . Eczema 10/11/2016  . Morbid obesity (HCC) 10/31/2007  . MIGRAINE HEADACHE 10/31/2007    Outpatient Encounter Medications as of 09/05/2017  Medication Sig  . clobetasol ointment (TEMOVATE) 0.05 % Apply 1 application topically as needed.  Marland Kitchen. FLUoxetine (PROZAC) 20 MG tablet Take 1 tablet (20 mg total) by mouth daily. Week prior to period.  Marland Kitchen. amoxicillin-clavulanate (AUGMENTIN) 875-125 MG tablet Take 1 tablet by mouth 2 (two) times daily.  . fluticasone (FLONASE) 50 MCG/ACT nasal spray Place 2 sprays into both nostrils daily.   No facility-administered encounter medications on file as of 09/05/2017.     No Known Allergies  Review of Systems  Constitutional: Positive for fatigue. Negative for activity change, appetite change and unexpected weight change.  HENT: Positive for congestion, postnasal drip, rhinorrhea, sinus pressure and sinus pain. Negative for dental problem.   Eyes: Negative for redness and visual disturbance.  Respiratory: Negative for cough and shortness of breath.   Cardiovascular: Negative for chest pain, palpitations and leg swelling.  Gastrointestinal: Negative for constipation, diarrhea and vomiting.  Genitourinary: Negative for difficulty urinating, frequency and menstrual problem.  Musculoskeletal: Negative for arthralgias and myalgias.    Neurological: Negative for dizziness and headaches.  Psychiatric/Behavioral: Positive for sleep disturbance. Negative for dysphoric mood. The patient is not nervous/anxious.     BP 112/72 (BP Location: Right Arm, Patient Position: Sitting, Cuff Size: Large)   Pulse 79   Temp (!) 97.4 F (36.3 C) (Temporal)   Ht 5\' 1"  (1.549 m)   Wt 238 lb 4 oz (108.1 kg)   SpO2 99%   BMI 45.02 kg/m   Physical Exam  Constitutional: She is oriented to person, place, and time. She appears well-developed and well-nourished.  HENT:  Head: Normocephalic and atraumatic.  Right Ear: External ear normal.  Left Ear: External ear normal.  Mouth/Throat: Oropharynx is clear and moist.  Frontal and maxillary sinuses are tender to palpation.  Nasal membranes are erythematous and swollen.  Posterior pharynx is mildly erythematous, no exudate.  Eyes: Conjunctivae are normal. Pupils are equal, round, and reactive to light.  Neck: Normal range of motion. Neck supple. No thyromegaly present.  Cardiovascular: Normal rate, regular rhythm and normal heart sounds.  Pulmonary/Chest: Effort normal and breath sounds normal. No respiratory distress.  Abdominal: Soft. Bowel sounds are normal.  Lymphadenopathy:    She has cervical adenopathy.  Neurological: She is alert and oriented to person, place, and time.  Gait normal  Skin: Skin is warm and dry.  Psychiatric: She has a normal mood and affect. Her behavior is normal. Thought content normal.  Nursing note and vitals reviewed.   ASSESSMENT/PLAN:  1. Acute non-recurrent maxillary sinusitis Because symptoms have been persisting for 2 weeks, and feels like they are getting worse I do believe an antibiotic  is indicated.  Discussed symptomatic care.   Patient Instructions  Take the antibiotic as instructed Use the flonase for congestion Push fluids Cal if not better in a few days    Eustace Moore, MD

## 2017-09-05 NOTE — Patient Instructions (Signed)
Take the antibiotic as instructed Use the flonase for congestion Push fluids Cal if not better in a few days

## 2017-12-08 ENCOUNTER — Encounter: Payer: Self-pay | Admitting: Family Medicine

## 2018-07-27 ENCOUNTER — Ambulatory Visit (INDEPENDENT_AMBULATORY_CARE_PROVIDER_SITE_OTHER): Admitting: Podiatry

## 2018-07-27 ENCOUNTER — Encounter: Payer: Self-pay | Admitting: Podiatry

## 2018-07-27 ENCOUNTER — Other Ambulatory Visit: Payer: Self-pay | Admitting: Podiatry

## 2018-07-27 ENCOUNTER — Ambulatory Visit (INDEPENDENT_AMBULATORY_CARE_PROVIDER_SITE_OTHER)

## 2018-07-27 VITALS — BP 99/63 | HR 79

## 2018-07-27 DIAGNOSIS — M79671 Pain in right foot: Secondary | ICD-10-CM | POA: Diagnosis not present

## 2018-07-27 DIAGNOSIS — M76821 Posterior tibial tendinitis, right leg: Secondary | ICD-10-CM

## 2018-07-27 DIAGNOSIS — M79672 Pain in left foot: Secondary | ICD-10-CM

## 2018-07-27 DIAGNOSIS — M76822 Posterior tibial tendinitis, left leg: Secondary | ICD-10-CM | POA: Diagnosis not present

## 2018-07-31 ENCOUNTER — Telehealth: Payer: Self-pay | Admitting: Podiatry

## 2018-07-31 NOTE — Telephone Encounter (Signed)
Pt aware insurance will not cover the braces pt was asking if she could possibly go ahead with the surgery.

## 2018-08-02 NOTE — Progress Notes (Signed)
Subjective:   Patient ID: Wendy Watkins, female   DOB: 43 y.o.   MRN: [id number]   HPI Patient presents stating she has had significant flatfoot deformity and has pain in her arch which is lasted for at least 10 years.  States that it gradually has gotten worse over that time and increasingly difficult for her to be active and patient would like to be more active and does not currently smoke   Review of Systems  All other systems reviewed and are negative.       Objective:  Physical Exam Vitals signs and nursing note reviewed.  Constitutional:      Appearance: She is well-developed.  Pulmonary:     Effort: Pulmonary effort is normal.  Musculoskeletal: Normal range of motion.  Skin:    General: Skin is warm.  Neurological:     Mental Status: She is alert.     Neurovascular status found to be intact muscle strength is adequate range of motion within normal limits.  Patient is found to have severe flatfoot deformity bilateral with swelling of the medial ankle bilateral and dysfunction of the posterior tibial tendon at this time.  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  I do think this patient would do best with AFO bracing and I reviewed that fact with her and explained AFO bracing to her.  Patient wants to go this course and I had ped orthotist meet with her to discuss and both are in agreement that this would be best.  Patient will be seen back for this particular procedure to try to provide better stability for her.  X-ray indicates there is significant depression of the arch bilateral with signs of arthritis subtalar joint of a moderate nature

## 2018-08-04 NOTE — Telephone Encounter (Signed)
Fine, needs to make appointment when I ge tback

## 2018-08-13 ENCOUNTER — Ambulatory Visit (INDEPENDENT_AMBULATORY_CARE_PROVIDER_SITE_OTHER): Admitting: Podiatry

## 2018-08-13 ENCOUNTER — Encounter: Payer: Self-pay | Admitting: Podiatry

## 2018-08-13 DIAGNOSIS — M76822 Posterior tibial tendinitis, left leg: Secondary | ICD-10-CM

## 2018-08-13 DIAGNOSIS — M76821 Posterior tibial tendinitis, right leg: Secondary | ICD-10-CM

## 2018-08-13 DIAGNOSIS — M79672 Pain in left foot: Secondary | ICD-10-CM | POA: Diagnosis not present

## 2018-08-13 DIAGNOSIS — M79671 Pain in right foot: Secondary | ICD-10-CM

## 2018-08-13 NOTE — Progress Notes (Signed)
Subjective:   Patient ID: Wendy Watkins, female   DOB: 43 y.o.   MRN: [id number]   HPI Patient presents stating that she really knows she is can need surgery on her feet as she is tried many supportive shoes she cannot wear the braces and she continues to have a lot of pain in the arch and into the ankle left over right foot.  States that it is become gradually more intense for her and she is off of work now and would like to get something done.  Patient does not smoke and likes to be active   Review of Systems  All other systems reviewed and are negative.       Objective:  Physical Exam Vitals signs and nursing note reviewed.  Constitutional:      Appearance: She is well-developed.  Pulmonary:     Effort: Pulmonary effort is normal.  Musculoskeletal: Normal range of motion.  Skin:    General: Skin is warm.  Neurological:     Mental Status: She is alert.     Neurovascular status intact muscle strength is adequate range of motion within normal limits with patient found to have significant depression of the arch with equinus condition noted bilateral.  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

## 2018-08-13 NOTE — Patient Instructions (Signed)
Pre-Operative Instructions  Congratulations, you have decided to take an important step towards improving your quality of life.  You can be assured that the doctors and staff at [facility] will be with you every step of the way.  Here are some important things you should know:  1. Plan to be at the surgery center/hospital at least 1 (one) hour prior to your scheduled time, unless otherwise directed by the surgical center/hospital staff.  You must have a responsible adult accompany you, remain during the surgery and drive you home.  Make sure you have directions to the surgical center/hospital to ensure you arrive on time. 2. If you are having surgery at [facility] or [facility] hospitals, you will need a copy of your medical history and physical form from your family physician within one month prior to the date of surgery. We will give you a form for your primary physician to complete.  3. We make every effort to accommodate the date you request for surgery.  However, there are times where surgery dates or times have to be moved.  We will contact you as soon as possible if a change in schedule is required.   4. No aspirin/ibuprofen for one week before surgery.  If you are on aspirin, any non-steroidal anti-inflammatory medications (Mobic, Aleve, Ibuprofen) should not be taken seven (7) days prior to your surgery.  You make take Tylenol for pain prior to surgery.  5. Medications - If you are taking daily heart and blood pressure medications, seizure, reflux, allergy, asthma, anxiety, pain or diabetes medications, make sure you notify the surgery center/hospital before the day of surgery so they can tell you which medications you should take or avoid the day of surgery. 6. No food or drink after midnight the night before surgery unless directed otherwise by surgical center/hospital staff. 7. No alcoholic beverages 24-hours prior to surgery.  No smoking 24-hours prior or 24-hours after  surgery. 8. Wear loose pants or shorts. They should be loose enough to fit over bandages, boots, and casts. 9. Don't wear slip-on shoes. Sneakers are preferred. 10. Bring your boot with you to the surgery center/hospital.  Also bring crutches or a walker if your physician has prescribed it for you.  If you do not have this equipment, it will be provided for you after surgery. 11. If you have not been contacted by the surgery center/hospital by the day before your surgery, call to confirm the date and time of your surgery. 12. Leave-time from work may vary depending on the type of surgery you have.  Appropriate arrangements should be made prior to surgery with your employer. 13. Prescriptions will be provided immediately following surgery by your doctor.  Fill these as soon as possible after surgery and take the medication as directed. Pain medications will not be refilled on weekends and must be approved by the doctor. 14. Remove nail polish on the operative foot and avoid getting pedicures prior to surgery. 15. Wash the night before surgery.  The night before surgery wash the foot and leg well with water and the antibacterial soap provided. Be sure to pay special attention to beneath the toenails and in between the toes.  Wash for at least three (3) minutes. Rinse thoroughly with water and dry well with a towel.  Perform this wash unless told not to do so by your physician.  Enclosed: 1 Ice pack (please put in freezer the night before surgery)   1 Hibiclens skin cleaner     Pre-op instructions  If you have any questions regarding the instructions, please do not hesitate to call our office.  [place]: [address], [place], [place] [address] -- [phone]  [place]: [address] [place], [place] [address] -- [phone]  [place]: [address]  [place], [place] [address] -- [phone]  [place]: [address], [address], [place], [place] [address] -- [phone]  Website:  [email or web address] 

## 2018-08-21 ENCOUNTER — Telehealth: Payer: Self-pay | Admitting: *Deleted

## 2018-08-21 NOTE — Telephone Encounter (Signed)
"  I was calling in reference to the set up of my surgery.  I haven't heard back from anybody.  If you have any information, please ive me a call back.

## 2018-08-22 NOTE — Telephone Encounter (Signed)
"  Calling in reference to the set up of my surgery.  If you would, give me a call back."  I'm returning your call.  How can I help you?  "I'm supposed to schedule my surgery."  Have you signed consent forms?  "No, I have not done anything.  He was supposed to review my case.  He said he would call me back in a week to let me know what he's decided."  Let me see if he has consulted with the other doctors and I will call you back.

## 2018-08-28 ENCOUNTER — Telehealth: Payer: Self-pay | Admitting: *Deleted

## 2018-08-28 DIAGNOSIS — M79672 Pain in left foot: Secondary | ICD-10-CM

## 2018-08-28 DIAGNOSIS — M76822 Posterior tibial tendinitis, left leg: Principal | ICD-10-CM

## 2018-08-28 DIAGNOSIS — M76821 Posterior tibial tendinitis, right leg: Secondary | ICD-10-CM

## 2018-08-28 NOTE — Telephone Encounter (Signed)
"  Hello Ms. Wendy Watkins, this is [employer] again.  Please give me a call."

## 2018-08-28 NOTE — Telephone Encounter (Signed)
Home phone is busy will call again for information on most painful foot.

## 2018-08-29 NOTE — Telephone Encounter (Signed)
We have ordered mri to  decide best procedure

## 2018-08-29 NOTE — Telephone Encounter (Signed)
I am calling to let you know that Dr. Charlsie Merles wants you to have a MRI.  Someone from [facility] will give you a call to schedule your appointment.

## 2018-08-29 NOTE — Telephone Encounter (Signed)
[facility] [employer] states they do accept CHAMPVA and pre-cert is not needed. Faxed orders to [facility].

## 2018-08-29 NOTE — Telephone Encounter (Signed)
I spoke with pt and states both feet hurt but the left is worse. Dr. Charlsie Merles ordered MRI of left ankle.

## 2018-09-04 ENCOUNTER — Ambulatory Visit
Admission: RE | Admit: 2018-09-04 | Discharge: 2018-09-04 | Disposition: A | Discharge status: Home / Self Care | Source: Ambulatory Visit | Attending: Podiatry | Admitting: Podiatry

## 2018-09-07 NOTE — Telephone Encounter (Signed)
Please schedule with Dr. Ardelle Anton to discuss flat foot repair

## 2018-09-10 ENCOUNTER — Telehealth: Payer: Self-pay | Admitting: *Deleted

## 2018-09-10 NOTE — Telephone Encounter (Signed)
I informed pt of Dr. Beverlee Nims review of MRI results and recommendation. Pt states understanding and I transferred to scheduler for an appt with Dr. Ardelle Anton.

## 2018-09-10 NOTE — Telephone Encounter (Signed)
-----   Message from Lenn Sink, DPM sent at 09/07/2018  7:39 AM EST ----- Please schedule with Dr. Ardelle Anton to discuss flat foot repair

## 2018-09-17 ENCOUNTER — Other Ambulatory Visit: Payer: Self-pay

## 2018-09-17 ENCOUNTER — Ambulatory Visit (INDEPENDENT_AMBULATORY_CARE_PROVIDER_SITE_OTHER): Admitting: Podiatry

## 2018-09-17 ENCOUNTER — Ambulatory Visit (INDEPENDENT_AMBULATORY_CARE_PROVIDER_SITE_OTHER)

## 2018-09-17 DIAGNOSIS — M2142 Flat foot [pes planus] (acquired), left foot: Secondary | ICD-10-CM

## 2018-09-17 DIAGNOSIS — M216X1 Other acquired deformities of right foot: Secondary | ICD-10-CM

## 2018-09-17 DIAGNOSIS — M76821 Posterior tibial tendinitis, right leg: Secondary | ICD-10-CM

## 2018-09-17 DIAGNOSIS — M76822 Posterior tibial tendinitis, left leg: Secondary | ICD-10-CM

## 2018-09-17 DIAGNOSIS — M216X2 Other acquired deformities of left foot: Secondary | ICD-10-CM

## 2018-09-17 NOTE — Patient Instructions (Signed)
Pre-Operative Instructions  Congratulations, you have decided to take an important step towards improving your quality of life.  You can be assured that the doctors and staff at [facility] will be with you every step of the way.  Here are some important things you should know:  1. Plan to be at the surgery center/hospital at least 1 (one) hour prior to your scheduled time, unless otherwise directed by the surgical center/hospital staff.  You must have a responsible adult accompany you, remain during the surgery and drive you home.  Make sure you have directions to the surgical center/hospital to ensure you arrive on time. 2. If you are having surgery at [facility] or [facility], you will need a copy of your medical history and physical form from your family physician within one month prior to the date of surgery. We will give you a form for your primary physician to complete.  3. We make every effort to accommodate the date you request for surgery.  However, there are times where surgery dates or times have to be moved.  We will contact you as soon as possible if a change in schedule is required.   4. No aspirin/ibuprofen for one week before surgery.  If you are on aspirin, any non-steroidal anti-inflammatory medications (Mobic, Aleve, Ibuprofen) should not be taken seven (7) days prior to your surgery.  You make take Tylenol for pain prior to surgery.  5. Medications - If you are taking daily heart and blood pressure medications, seizure, reflux, allergy, asthma, anxiety, pain or diabetes medications, make sure you notify the surgery center/hospital before the day of surgery so they can tell you which medications you should take or avoid the day of surgery. 6. No food or drink after midnight the night before surgery unless directed otherwise by surgical center/hospital staff. 7. No alcoholic beverages 24-hours prior to surgery.  No smoking 24-hours prior or 24-hours after  surgery. 8. Wear loose pants or shorts. They should be loose enough to fit over bandages, boots, and casts. 9. Don't wear slip-on shoes. Sneakers are preferred. 10. Bring your boot with you to the surgery center/hospital.  Also bring crutches or a walker if your physician has prescribed it for you.  If you do not have this equipment, it will be provided for you after surgery. 11. If you have not been contacted by the surgery center/hospital by the day before your surgery, call to confirm the date and time of your surgery. 12. Leave-time from work may vary depending on the type of surgery you have.  Appropriate arrangements should be made prior to surgery with your employer. 13. Prescriptions will be provided immediately following surgery by your doctor.  Fill these as soon as possible after surgery and take the medication as directed. Pain medications will not be refilled on weekends and must be approved by the doctor. 14. Remove nail polish on the operative foot and avoid getting pedicures prior to surgery. 15. Wash the night before surgery.  The night before surgery wash the foot and leg well with water and the antibacterial soap provided. Be sure to pay special attention to beneath the toenails and in between the toes.  Wash for at least three (3) minutes. Rinse thoroughly with water and dry well with a towel.  Perform this wash unless told not to do so by your physician.  Enclosed: 1 Ice pack (please put in freezer the night before surgery)   1 Hibiclens skin cleaner     Pre-op instructions  If you have any questions regarding the instructions, please do not hesitate to call our office.  [place]: [address], [place], [place] [address] -- [phone]  [employer]: [address] , [place] [address] -- [phone]  [place]: [address]  [place], [place] [address] -- [phone]  [place]: [address], [address], [place], [place] [address] -- [phone]  Website:  [email or web address] 

## 2018-09-20 NOTE — Progress Notes (Signed)
Subjective: 43 year old female presents the office today for surgical consultation.  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  She is tried shoe inserts, custom orthotic foot and significant improvement.  She was also trying to get an AFO ankle brace however insurance denied this.  Given her prolonged nature of symptoms she wishes to undergo surgery given her pain.  She has pain on the arch of the foot at times as well the outside aspect. Denies any systemic complaints such as fevers, chills, nausea, vomiting. No acute changes since last appointment, and no other complaints at this time.   Objective: AAO x3, NAD DP/PT pulses palpable bilaterally, CRT less than 3 seconds Significant flatfoot deformities present.  Equinus is present.  She is getting diffuse tenderness to both of her feet with the left side worse than the right.  She gets tenderness along the course of the posterior tibial tendon but overall the tendon appears to be intact.  She is able to do a single and double heel rise.  There is tenderness on the sinus tarsi of the lateral aspect the foot.  She gets discomfort in the arch of the foot as well.  No open lesions or pre-ulcerative lesions.  No pain with calf compression, swelling, warmth, erythema  Assessment: Symptomatic left deformity  Plan: -All treatment options discussed with the patient including all alternatives, risks, complications.  -I viewed the x-rays with her as well as the MRI.  I discussed the case with other colleagues.  After discussion we elected for reconstruction.  Discussed the gastrocnemius recession, medial calcaneal slide osteotomy, cotton osteotomy, Evans calcaneal osteotomy. -We discussed the surgery at length as well as postoperative course. -The incision placement as well as the postoperative course was discussed with the patient. I discussed risks of the surgery which include,  but not limited to, infection, bleeding, pain, swelling, need for further surgery, delayed or nonhealing, painful or ugly scar, numbness or sensation changes, over/under correction, recurrence, transfer lesions, further deformity, hardware failure, DVT/PE, loss of toe/foot. Patient understands these risks and wishes to proceed with surgery. The surgical consent was reviewed with the patient all 3 pages were signed. No promises or guarantees were given to the outcome of the procedure. All questions were answered to the best of my ability. Before the surgery the patient was encouraged to call the office if there is any further questions. The surgery will be performed at the [facility] on an outpatient basis. -Patient encouraged to call the office with any questions, concerns, change in symptoms.   Vivi Barrack DPM

## 2018-09-24 ENCOUNTER — Ambulatory Visit: Admitting: Podiatry

## 2018-10-26 ENCOUNTER — Telehealth: Payer: Self-pay | Admitting: *Deleted

## 2018-10-26 NOTE — Telephone Encounter (Signed)
I spoke to the rep about the hardware and grafts and don't know if we will be able to do this at [facility] due to insurance. I will work on this and try to get an answer by Monday. Please let her know that it may not be on the 29th and we may have to move the location.

## 2018-10-26 NOTE — Telephone Encounter (Signed)
I called and informed the patient that we may have to change the location for her surgery to [facility] and to a different location due to issues with the coverage of the bone graft at [facility].  I told her I would call her on Monday and let her know for sure.

## 2018-10-26 NOTE — Telephone Encounter (Addendum)
"  I'm calling to see if you guys know when you're going to start back doing surgeries."  I am returning your call.  We are able to do surgeries now.  Would you like to schedule it now?  "Yes, I'd like to do it as soon as possible."  He does surgeries on Wednesdays.  He can do it Wednesday of next week, October 31, 2018.  "That date will be fine."  I'll get it scheduled.  Someone from the surgical center will call you a day or two prior to your surgery date and they will give you your arrival time.  You need to go online and register with the surgical center, instructions are in the brochure that we gave you.  I scheduled the surgery for 10/31/2018 via the surgical center's [employer] Portal.

## 2018-10-29 ENCOUNTER — Telehealth: Payer: Self-pay | Admitting: *Deleted

## 2018-10-29 NOTE — Telephone Encounter (Signed)
I am calling to let you know we have to cancel your surgery for Wednesday, October 31, 2018.  Dr. Ardelle Anton said we could do it at [facility] but it would take a while to get you scheduled because they don't open the scheduling until December 03, 2018.  He said he's going to get with the director and supply person at the surgical center and see if they can come up with cheaper implants that they can use.  "When is he going to do that?"  He's going to work on it this week.  "Okay great, because I would like to have this surgery as soon as possible while I'm out of work."  I'll let him know.  As soon as I hear something, I'll let you know.  I canceled the surgery scheduled for October 31, 2018 via the surgical center's [employer] Portal.

## 2018-11-02 ENCOUNTER — Telehealth: Payer: Self-pay | Admitting: *Deleted

## 2018-11-02 NOTE — Telephone Encounter (Signed)
"  I'm calling to see if there has been any news about my surgery.  If you would, please give me a call."

## 2018-11-02 NOTE — Telephone Encounter (Signed)
The surgery center could not do this surgery. It will have to be done at the hospital. I would prefer to do it at [facility] as an outpatient. I would like for Dr. Samuella Cota to assist.

## 2018-11-02 NOTE — Telephone Encounter (Signed)
I sent a message about it this morning. I spoke with the surgery center and they said due to cost they cannot do it. It will  Have to be done at the hospital unfortunately. I would prefer to do it at [facility] and have Dr. Samuella Cota assist. Not sure when we will be able to do it. Please send my apologies. I know she wants it done ASAP but due to COVID and insurance my hands are tied.

## 2018-11-02 NOTE — Telephone Encounter (Signed)
I am returning your call.  Unfortunately Dr. Ardelle Anton was not able to work anything out with Catering manager at [facility].  He said your surgery will need to be done in a [facility] facility.  [facility] has not reopened surgery for elective surgeries at this time.  I will not be able to get you scheduled until after June 1.  I tried to go online to see if any dates were available in June but the system is down.  I will try again on Monday.  If any dates are available I will call you.  One stipulation for having surgery at a [facility] facility is that you must have a history and physical done within a 30 day time period of the surgery date by your primary care physician.  "How do I go about getting the form to take to my doctor?"  I can mail it to you.  "Okay, thank you so much."

## 2018-11-07 NOTE — Telephone Encounter (Signed)
"  You said you were going to call me about doing my surgery at [facility] or [facility]."  I am looking at December 05, 2018.  "Do I need to come by to get the form or will you mail it to me?"  I'll mail it to you.  You will need a Covid 19 test completed but the pre-op nurse will give you instructions about that.  "Will you call me about the time?"  Yes, I'll let you know the time.  You'll get a call from the pre-op nurse regarding the arrival time.  I mailed her the history and physical form.

## 2018-11-30 ENCOUNTER — Other Ambulatory Visit: Payer: Self-pay

## 2018-11-30 ENCOUNTER — Encounter (HOSPITAL_BASED_OUTPATIENT_CLINIC_OR_DEPARTMENT_OTHER): Payer: Self-pay

## 2018-11-30 NOTE — Progress Notes (Signed)
SPOKE W/  Wendy Watkins     SCREENING SYMPTOMS OF COVID 19:   COUGH--NO  RUNNY NOSE--- NO  SORE THROAT---NO  NASAL CONGESTION----NO  SNEEZING----NO  SHORTNESS OF BREATH---NO  DIFFICULTY BREATHING---NO  TEMP >100.0 -----NO  UNEXPLAINED BODY ACHES------NO  CHILLS -------- NO  HEADACHES ---------NO  LOSS OF SMELL/ TASTE --------NO    HAVE YOU OR ANY FAMILY MEMBER TRAVELLED PAST 14 DAYS OUT OF THE   COUNTY---[place] in [place]----NO COUNTRY----NO  HAVE YOU OR ANY FAMILY MEMBER BEEN EXPOSED TO ANYONE WITH COVID 19? NO

## 2018-11-30 NOTE — Progress Notes (Addendum)
Spoke with:  Wendy Watkins NPO:  After Midnight, no gum, candy, or mints   Arrival time:  0945AM H&P present with chart Labs: Hgb AM medications: None Pre op orders: Yes Ride home:  Wendy Watkins (husband) [phone]

## 2018-12-03 ENCOUNTER — Other Ambulatory Visit (HOSPITAL_COMMUNITY)
Admission: RE | Admit: 2018-12-03 | Discharge: 2018-12-03 | Disposition: A | Discharge status: Home / Self Care | Source: Ambulatory Visit | Attending: Podiatry | Admitting: Podiatry

## 2018-12-03 ENCOUNTER — Other Ambulatory Visit: Payer: Self-pay

## 2018-12-03 DIAGNOSIS — Z1159 Encounter for screening for other viral diseases: Secondary | ICD-10-CM | POA: Insufficient documentation

## 2018-12-04 NOTE — Progress Notes (Signed)
SPOKE W/  _ [name]    SCREENING SYMPTOMS OF COVID 19:   COUGH--no  RUNNY NOSE---  no SORE THROAT---no  NASAL CONGESTION----no  SNEEZING---- no SHORTNESS OF BREATH---no  DIFFICULTY BREATHING---no  TEMP >100.0 -----no  UNEXPLAINED BODY ACHES------no  CHILLS -------- no  HEADACHES ---------no  LOSS OF SMELL/ TASTE --------no    HAVE YOU OR ANY FAMILY MEMBER TRAVELLED PAST 14 DAYS OUT OF THE   COUNTY---no  STATE----no COUNTRY----no HAVE YOU OR ANY FAMILY MEMBER BEEN EXPOSED TO ANYONE WITH COVID 19?  no

## 2018-12-05 ENCOUNTER — Ambulatory Visit (HOSPITAL_BASED_OUTPATIENT_CLINIC_OR_DEPARTMENT_OTHER): Admitting: Anesthesiology

## 2018-12-05 ENCOUNTER — Ambulatory Visit (HOSPITAL_COMMUNITY)

## 2018-12-05 ENCOUNTER — Encounter (HOSPITAL_BASED_OUTPATIENT_CLINIC_OR_DEPARTMENT_OTHER)
Admission: RE | Disposition: A | Discharge status: Home / Self Care | Payer: Self-pay | Source: Home / Self Care | Attending: Podiatry

## 2018-12-05 ENCOUNTER — Encounter: Payer: Self-pay | Admitting: Podiatry

## 2018-12-05 ENCOUNTER — Encounter (HOSPITAL_BASED_OUTPATIENT_CLINIC_OR_DEPARTMENT_OTHER): Payer: Self-pay | Admitting: Anesthesiology

## 2018-12-05 ENCOUNTER — Ambulatory Visit (HOSPITAL_BASED_OUTPATIENT_CLINIC_OR_DEPARTMENT_OTHER)
Admission: RE | Admit: 2018-12-05 | Discharge: 2018-12-05 | Disposition: A | Discharge status: Home / Self Care | Attending: Podiatry | Admitting: Podiatry

## 2018-12-05 ENCOUNTER — Other Ambulatory Visit: Payer: Self-pay

## 2018-12-05 DIAGNOSIS — Z79899 Other long term (current) drug therapy: Secondary | ICD-10-CM | POA: Insufficient documentation

## 2018-12-05 DIAGNOSIS — M2142 Flat foot [pes planus] (acquired), left foot: Secondary | ICD-10-CM | POA: Diagnosis not present

## 2018-12-05 DIAGNOSIS — Z419 Encounter for procedure for purposes other than remedying health state, unspecified: Secondary | ICD-10-CM

## 2018-12-05 DIAGNOSIS — M216X2 Other acquired deformities of left foot: Secondary | ICD-10-CM | POA: Insufficient documentation

## 2018-12-05 DIAGNOSIS — E039 Hypothyroidism, unspecified: Secondary | ICD-10-CM | POA: Diagnosis not present

## 2018-12-05 HISTORY — PX: GASTROC RECESSION EXTREMITY: SHX6262

## 2018-12-05 HISTORY — PX: OSTECTOMY: SHX6439

## 2018-12-05 HISTORY — DX: Excessive and frequent menstruation with regular cycle: N92.0

## 2018-12-05 HISTORY — DX: Compression of brain: G93.5

## 2018-12-05 HISTORY — DX: Obesity, unspecified: E66.9

## 2018-12-05 HISTORY — DX: Hypothyroidism, unspecified: E03.9

## 2018-12-05 HISTORY — DX: Urge incontinence: N39.41

## 2018-12-05 HISTORY — DX: Presence of spectacles and contact lenses: Z97.3

## 2018-12-05 HISTORY — DX: Migraine, unspecified, not intractable, without status migrainosus: G43.909

## 2018-12-05 HISTORY — PX: CALCANEAL OSTEOTOMY: SHX1281

## 2018-12-05 HISTORY — DX: Dermatitis, unspecified: L30.9

## 2018-12-05 HISTORY — DX: Personal history of diseases of the blood and blood-forming organs and certain disorders involving the immune mechanism: Z86.2

## 2018-12-05 LAB — NOVEL CORONAVIRUS, NAA (HOSP ORDER, SEND-OUT TO REF LAB; TAT 18-24 HRS): SARS-CoV-2, NAA: NOT DETECTED

## 2018-12-05 LAB — POCT HEMOGLOBIN-HEMACUE: Hemoglobin: 13.7 g/dL (ref 12.0–15.0)

## 2018-12-05 SURGERY — RECESSION, TENDON, GASTROCNEMIUS
Anesthesia: General | Laterality: Left

## 2018-12-05 MED ORDER — FENTANYL CITRATE (PF) 100 MCG/2ML IJ SOLN
INTRAMUSCULAR | Status: AC
  Filled 2018-12-05: qty 2

## 2018-12-05 MED ORDER — FENTANYL CITRATE (PF) 100 MCG/2ML IJ SOLN
100.0000 ug | Freq: Once | INTRAMUSCULAR | Status: DC
  Filled 2018-12-05: qty 2

## 2018-12-05 MED ORDER — PROPOFOL 10 MG/ML IV BOLUS
INTRAVENOUS | Status: AC
  Filled 2018-12-05: qty 20

## 2018-12-05 MED ORDER — ONDANSETRON HCL 4 MG/2ML IJ SOLN
INTRAMUSCULAR | Status: DC | PRN
  Administered 2018-12-05: 4 mg via INTRAVENOUS

## 2018-12-05 MED ORDER — DEXAMETHASONE SODIUM PHOSPHATE 4 MG/ML IJ SOLN
INTRAMUSCULAR | Status: DC | PRN
  Administered 2018-12-05: 10 mg via INTRAVENOUS

## 2018-12-05 MED ORDER — FENTANYL CITRATE (PF) 100 MCG/2ML IJ SOLN
INTRAMUSCULAR | Status: DC | PRN
  Administered 2018-12-05 (×8): 25 ug via INTRAVENOUS
  Administered 2018-12-05: 100 ug
  Administered 2018-12-05 (×4): 25 ug via INTRAVENOUS

## 2018-12-05 MED ORDER — MIDAZOLAM HCL 2 MG/2ML IJ SOLN
INTRAMUSCULAR | Status: AC
  Filled 2018-12-05: qty 2

## 2018-12-05 MED ORDER — CEFAZOLIN SODIUM-DEXTROSE 2-4 GM/100ML-% IV SOLN
INTRAVENOUS | Status: AC
  Filled 2018-12-05: qty 100

## 2018-12-05 MED ORDER — DEXAMETHASONE SODIUM PHOSPHATE 10 MG/ML IJ SOLN
INTRAMUSCULAR | Status: AC
  Filled 2018-12-05: qty 1

## 2018-12-05 MED ORDER — ONDANSETRON HCL 4 MG/2ML IJ SOLN
INTRAMUSCULAR | Status: AC
  Filled 2018-12-05: qty 2

## 2018-12-05 MED ORDER — CHLORHEXIDINE GLUCONATE CLOTH 2 % EX PADS
6.0000 | MEDICATED_PAD | Freq: Once | CUTANEOUS | Status: DC
  Filled 2018-12-05: qty 6

## 2018-12-05 MED ORDER — LIDOCAINE 2% (20 MG/ML) 5 ML SYRINGE
INTRAMUSCULAR | Status: AC
  Filled 2018-12-05: qty 5

## 2018-12-05 MED ORDER — GLYCOPYRROLATE 0.2 MG/ML IJ SOLN
INTRAMUSCULAR | Status: DC | PRN
  Administered 2018-12-05: 0.2 mg via INTRAVENOUS

## 2018-12-05 MED ORDER — CEFAZOLIN SODIUM-DEXTROSE 2-4 GM/100ML-% IV SOLN
2.0000 g | INTRAVENOUS | Status: AC
  Administered 2018-12-05: 12:00:00 2 g via INTRAVENOUS
  Filled 2018-12-05: qty 100

## 2018-12-05 MED ORDER — LACTATED RINGERS IV SOLN
INTRAVENOUS | Status: DC
  Administered 2018-12-05 (×3): via INTRAVENOUS
  Filled 2018-12-05: qty 1000

## 2018-12-05 MED ORDER — PROPOFOL 10 MG/ML IV BOLUS
INTRAVENOUS | Status: DC | PRN
  Administered 2018-12-05: 50 mg via INTRAVENOUS
  Administered 2018-12-05: 200 mg via INTRAVENOUS

## 2018-12-05 MED ORDER — PROMETHAZINE HCL 25 MG PO TABS: 25.0000 mg | ORAL_TABLET | Freq: Three times a day (TID) | ORAL | 0 refills | Status: DC | PRN

## 2018-12-05 MED ORDER — MIDAZOLAM HCL 2 MG/2ML IJ SOLN
2.0000 mg | Freq: Once | INTRAMUSCULAR | Status: AC
  Administered 2018-12-05: 2 mg via INTRAVENOUS
  Filled 2018-12-05: qty 2

## 2018-12-05 MED ORDER — SCOPOLAMINE 1 MG/3DAYS TD PT72
MEDICATED_PATCH | TRANSDERMAL | Status: DC | PRN
  Administered 2018-12-05: 1 via TRANSDERMAL

## 2018-12-05 MED ORDER — LIDOCAINE HCL (CARDIAC) PF 100 MG/5ML IV SOSY
PREFILLED_SYRINGE | INTRAVENOUS | Status: DC | PRN
  Administered 2018-12-05: 100 mg via INTRAVENOUS

## 2018-12-05 MED ORDER — CEPHALEXIN 500 MG PO CAPS: 500.0000 mg | ORAL_CAPSULE | Freq: Three times a day (TID) | ORAL | 0 refills | Status: AC

## 2018-12-05 MED ORDER — SCOPOLAMINE 1 MG/3DAYS TD PT72
MEDICATED_PATCH | TRANSDERMAL | Status: AC
  Filled 2018-12-05: qty 1

## 2018-12-05 MED ORDER — IBUPROFEN 800 MG PO TABS: 800.0000 mg | ORAL_TABLET | Freq: Three times a day (TID) | ORAL | 0 refills | Status: DC | PRN

## 2018-12-05 MED ORDER — OXYCODONE-ACETAMINOPHEN 10-325 MG PO TABS: 1.0000 | ORAL_TABLET | Freq: Four times a day (QID) | ORAL | 0 refills | Status: DC | PRN

## 2018-12-05 MED ORDER — GLYCOPYRROLATE PF 0.2 MG/ML IJ SOSY
PREFILLED_SYRINGE | INTRAMUSCULAR | Status: AC
  Filled 2018-12-05: qty 1

## 2018-12-05 SURGICAL SUPPLY — 70 items
.062 KWire ×2 IMPLANT
.094 KWire ×4 IMPLANT
ALLOGRAFT COTTON WEDGE 5 (Orthopedic Graft) ×2 IMPLANT
ALLOGRAFT EVANS WEDGE 6 (Orthopedic Graft) ×2 IMPLANT
BANDAGE ACE 3X5.8 VEL STRL LF (GAUZE/BANDAGES/DRESSINGS) ×3 IMPLANT
BANDAGE ELASTIC 4 VELCRO ST LF (GAUZE/BANDAGES/DRESSINGS) ×4 IMPLANT
BIT DRILL LONG ACUTRAK2 7.5 (BIT) IMPLANT
BLADE AVERAGE 25MMX9MM (BLADE) ×1
BLADE AVERAGE 25X9 (BLADE) ×2 IMPLANT
BLADE MIC 41X13 (BLADE) ×1 IMPLANT
BLADE MIC 41X13MM (BLADE) ×1
BLADE SURG 15 STRL LF DISP TIS (BLADE) ×2 IMPLANT
BLADE SURG 15 STRL SS (BLADE) ×10
BNDG CONFORM 2 STRL LF (GAUZE/BANDAGES/DRESSINGS) ×3 IMPLANT
BNDG ESMARK 4X9 LF (GAUZE/BANDAGES/DRESSINGS) ×3 IMPLANT
BNDG GAUZE ELAST 4 BULKY (GAUZE/BANDAGES/DRESSINGS) ×5 IMPLANT
COVER BACK TABLE REUSABLE LG (DRAPES) ×3 IMPLANT
COVER WAND RF STERILE (DRAPES) ×2 IMPLANT
CUFF TOURN SGL QUICK 18X4 (TOURNIQUET CUFF) IMPLANT
CUFF TOURN SGL QUICK 42 (TOURNIQUET CUFF) ×2 IMPLANT
Color Cuff Disposable Tourniquet ×2 IMPLANT
DRAPE C-ARMOR (DRAPES) ×4 IMPLANT
DRAPE EXTREMITY T 121X128X90 (DISPOSABLE) ×3 IMPLANT
DRAPE IMP U-DRAPE 54X76 (DRAPES) ×3 IMPLANT
DRAPE OEC MINIVIEW 54X84 (DRAPES) IMPLANT
DRILL LONG ACUTRAK2 7.5 (BIT) ×3
DRSG EMULSION OIL 3X3 NADH (GAUZE/BANDAGES/DRESSINGS) ×3 IMPLANT
DURAPREP 26ML APPLICATOR (WOUND CARE) ×3 IMPLANT
ELECT REM PT RETURN 9FT ADLT (ELECTROSURGICAL) ×3
ELECTRODE REM PT RTRN 9FT ADLT (ELECTROSURGICAL) ×1 IMPLANT
GAUZE 4X4 16PLY RFD (DISPOSABLE) IMPLANT
GAUZE SPONGE 4X4 12PLY STRL (GAUZE/BANDAGES/DRESSINGS) ×5 IMPLANT
GAUZE XEROFORM 1X8 LF (GAUZE/BANDAGES/DRESSINGS) ×2 IMPLANT
GLOVE BIO SURGEON STRL SZ7.5 (GLOVE) ×8 IMPLANT
GOWN STRL REUS W/ TWL LRG LVL3 (GOWN DISPOSABLE) ×1 IMPLANT
GOWN STRL REUS W/ TWL XL LVL3 (GOWN DISPOSABLE) ×1 IMPLANT
GOWN STRL REUS W/TWL LRG LVL3 (GOWN DISPOSABLE) ×2
GOWN STRL REUS W/TWL XL LVL3 (GOWN DISPOSABLE) ×2
GUIDEWIRE ORTHO .094INX9.25IN (WIRE) ×4 IMPLANT
GUIDEWIRE ORTHO.062IN X 9.25IN (WIRE) ×4 IMPLANT
Long Lap 2 Drill ×2 IMPLANT
MARKER SKIN DUAL TIP RULER LAB (MISCELLANEOUS) ×2 IMPLANT
NDL HYPO 25X1 1.5 SAFETY (NEEDLE) ×1 IMPLANT
NDL SAFETY ECLIPSE 18X1.5 (NEEDLE) IMPLANT
NEEDLE HYPO 18GX1.5 SHARP (NEEDLE)
NEEDLE HYPO 25X1 1.5 SAFETY (NEEDLE) ×3 IMPLANT
NS IRRIG 1000ML POUR BTL (IV SOLUTION) IMPLANT
PACK BASIN DAY SURGERY FS (CUSTOM PROCEDURE TRAY) ×3 IMPLANT
PAD ABD 8X10 STRL (GAUZE/BANDAGES/DRESSINGS) ×4 IMPLANT
PADDING CAST ABS 4INX4YD NS (CAST SUPPLIES) ×2
PADDING CAST ABS COTTON 4X4 ST (CAST SUPPLIES) ×1 IMPLANT
PENCIL BUTTON HOLSTER BLD 10FT (ELECTRODE) ×3 IMPLANT
SCREW CANN ACUTRAK2 7.5X45 (Screw) ×2 IMPLANT
SLEEVE SURGEON STRL (DRAPES) ×2 IMPLANT
STAPLER VISISTAT 35W (STAPLE) ×4 IMPLANT
STOCKINETTE 6  STRL (DRAPES) ×2
STOCKINETTE 6 STRL (DRAPES) ×1 IMPLANT
STRIP SUTURE WOUND CLOSURE 1/2 (SUTURE) IMPLANT
SUT MERSILENE 2.0 SH NDLE (SUTURE) ×1 IMPLANT
SUT MNCRL AB 3-0 PS2 18 (SUTURE) ×3 IMPLANT
SUT MNCRL AB 4-0 PS2 18 (SUTURE) ×4 IMPLANT
SUT MON AB 5-0 PS2 18 (SUTURE) ×1 IMPLANT
SUT PROLENE 4 0 PS 2 18 (SUTURE) ×4 IMPLANT
SUT VIC AB 3-0 PS2 18 (SUTURE) ×8
SUT VIC AB 3-0 PS2 18XBRD (SUTURE) IMPLANT
SUT VIC AB 4-0 PS2 18 (SUTURE) ×2 IMPLANT
SYR 10ML LL (SYRINGE) ×2 IMPLANT
SYR BULB 3OZ (MISCELLANEOUS) ×3 IMPLANT
Stryker ×1 IMPLANT
UNDERPAD 30X30 (UNDERPADS AND DIAPERS) ×3 IMPLANT

## 2018-12-05 NOTE — Transfer of Care (Signed)
Immediate Anesthesia Transfer of Care Note  Patient: Wendy Watkins  Procedure(s) Performed: Procedure(s) (LRB): GASTROC RECESSION EXTREMITY (Left) COTTON OSTEOTOMY, MEDIAL CALCANEAL SLIDE OSTEOTOMY, MANIPULATION OF THE ANKLE UNDER ANESTHESIA (Left) EVANCALCANEAL OSTEOTOMY (Left)  Patient Location: PACU  Anesthesia Type: General  Level of Consciousness: awake, sedated, patient cooperative and responds to stimulation  Airway & Oxygen Therapy: Patient Spontanous Breathing and Patient connected to [place] 02 with soft face mask   Post-op Assessment: Report given to PACU RN, Post -op Vital signs reviewed and stable and Patient moving all extremities  Post vital signs: Reviewed and stable  Complications: No apparent anesthesia complications

## 2018-12-05 NOTE — Discharge Instructions (Signed)
See written instructions Resume all home medications as prior   Ibuprofen tablets and capsules What is this medicine? IBUPROFEN (eye BYOO proe fen) is a non-steroidal anti-inflammatory drug (NSAID). It is used for dental pain, fever, headaches or migraines, osteoarthritis, rheumatoid arthritis, or painful monthly periods. It can also relieve minor aches and pains caused by a cold, flu, or sore throat. This medicine may be used for other purposes; ask your health care provider or pharmacist if you have questions. COMMON BRAND NAME(S): Advil, Advil Junior Strength, Advil Migraine, Genpril, Ibren, IBU, Midol, Midol Cramps and Body Aches, Motrin, Motrin IB, Motrin Junior Strength, Motrin Migraine Pain, Samson-8, Toxicology Saliva Collection What should I tell my health care provider before I take this medicine? They need to know if you have any of these conditions: -cigarette smoker -coronary artery bypass graft (CABG) surgery within the past 2 weeks -drink more than 3 alcohol-containing drinks a day -heart disease -high blood pressure -history of stomach bleeding -kidney disease -liver disease -lung or breathing disease, like asthma -an unusual or allergic reaction to ibuprofen, aspirin, other NSAIDs, other medicines, foods, dyes, or preservatives -pregnant or trying to get pregnant -breast-feeding How should I use this medicine? Take this medicine by mouth with a glass of water. Follow the directions on the prescription label. Take this medicine with food if your stomach gets upset. Try to not lie down for at least 10 minutes after you take the medicine. Take your medicine at regular intervals. Do not take your medicine more often than directed. A special MedGuide will be given to you by the pharmacist with each prescription and refill. Be sure to read this information carefully each time. Talk to your pediatrician regarding the use of this medicine in children. Special care may be  needed. Overdosage: If you think you have taken too much of this medicine contact a poison control center or emergency room at once. NOTE: This medicine is only for you. Do not share this medicine with others. What if I miss a dose? If you miss a dose, take it as soon as you can. If it is almost time for your next dose, take only that dose. Do not take double or extra doses. What may interact with this medicine? Do not take this medicine with any of the following medications: -cidofovir -ketorolac -methotrexate -pemetrexed This medicine may also interact with the following medications: -alcohol -aspirin -diuretics -lithium -other drugs for inflammation like prednisone -warfarin This list may not describe all possible interactions. Give your health care provider a list of all the medicines, herbs, non-prescription drugs, or dietary supplements you use. Also tell them if you smoke, drink alcohol, or use illegal drugs. Some items may interact with your medicine. What should I watch for while using this medicine? Tell your doctor or healthcare professional if your symptoms do not start to get better or if they get worse. This medicine does not prevent heart attack or stroke. In fact, this medicine may increase the chance of a heart attack or stroke. The chance may increase with longer use of this medicine and in people who have heart disease. If you take aspirin to prevent heart attack or stroke, talk with your doctor or health care professional. Do not take other medicines that contain aspirin, ibuprofen, or naproxen with this medicine. Side effects such as stomach upset, nausea, or ulcers may be more likely to occur. Many medicines available without a prescription should not be taken with this medicine. This medicine can cause  ulcers and bleeding in the stomach and intestines at any time during treatment. Ulcers and bleeding can happen without warning symptoms and can cause death. To reduce your  risk, do not smoke cigarettes or drink alcohol while you are taking this medicine. You may get drowsy or dizzy. Do not drive, use machinery, or do anything that needs mental alertness until you know how this medicine affects you. Do not stand or sit up quickly, especially if you are an older patient. This reduces the risk of dizzy or fainting spells. This medicine can cause you to bleed more easily. Try to avoid damage to your teeth and gums when you brush or floss your teeth. This medicine may be used to treat migraines. If you take migraine medicines for 10 or more days a month, your migraines may get worse. Keep a diary of headache days and medicine use. Contact your healthcare professional if your migraine attacks occur more frequently. What side effects may I notice from receiving this medicine? Side effects that you should report to your doctor or health care professional as soon as possible: -allergic reactions like skin rash, itching or hives, swelling of the face, lips, or tongue -severe stomach pain -signs and symptoms of bleeding such as bloody or black, tarry stools; red or dark-brown urine; spitting up blood or brown material that looks like coffee grounds; red spots on the skin; unusual bruising or bleeding from the eye, gums, or nose -signs and symptoms of a blood clot such as changes in vision; chest pain; severe, sudden headache; trouble speaking; sudden numbness or weakness of the face, arm, or leg -unexplained weight gain or swelling -unusually weak or tired -yellowing of eyes or skin Side effects that usually do not require medical attention (report to your doctor or health care professional if they continue or are bothersome): -bruising -diarrhea -dizziness, drowsiness -headache -nausea, vomiting This list may not describe all possible side effects. Call your doctor for medical advice about side effects. You may report side effects to FDA at 1-800-FDA-1088. Where should I  keep my medicine? Keep out of the reach of children. Store at room temperature between 15 and 30 degrees C (59 and 86 degrees F). Keep container tightly closed. Throw away any unused medicine after the expiration date. NOTE: This sheet is a summary. It may not cover all possible information. If you have questions about this medicine, talk to your doctor, pharmacist, or health care provider.  2019 Elsevier/Gold Standard (2017-02-22 12:43:57)   Acetaminophen; Oxycodone tablets What is this medicine? ACETAMINOPHEN; OXYCODONE (a set a MEE noe fen; ox i KOE done) is a pain reliever. It is used to treat moderate to severe pain. This medicine may be used for other purposes; ask your health care provider or pharmacist if you have questions. COMMON BRAND NAME(S): Endocet, Magnacet, Nalocet, Narvox, Percocet, Perloxx, Primalev, Primlev, Roxicet, Xolox What should I tell my health care provider before I take this medicine? They need to know if you have any of these conditions: -brain tumor -Crohn's disease, inflammatory bowel disease, or ulcerative colitis -drug abuse or addiction -head injury -heart or circulation problems -if you often drink alcohol -kidney disease or problems going to the bathroom -liver disease -lung disease, asthma, or breathing problems -an unusual or allergic reaction to acetaminophen, oxycodone, other opioid analgesics, other medicines, foods, dyes, or preservatives -pregnant or trying to get pregnant -breast-feeding How should I use this medicine? Take this medicine by mouth with a full glass of water. Follow the  directions on the prescription label. You can take it with or without food. If it upsets your stomach, take it with food. Take your medicine at regular intervals. Do not take it more often than directed. A special MedGuide will be given to you by the pharmacist with each prescription and refill. Be sure to read this information carefully each time. Talk to your  pediatrician regarding the use of this medicine in children. Special care may be needed. Overdosage: If you think you have taken too much of this medicine contact a poison control center or emergency room at once. NOTE: This medicine is only for you. Do not share this medicine with others. What if I miss a dose? If you miss a dose, take it as soon as you can. If it is almost time for your next dose, take only that dose. Do not take double or extra doses. What may interact with this medicine? This medicine may interact with the following medications: -alcohol -antihistamines for allergy, cough and cold -antiviral medicines for HIV or AIDS -atropine -certain antibiotics like clarithromycin, erythromycin, linezolid, rifampin -certain medicines for anxiety or sleep -certain medicines for bladder problems like oxybutynin, tolterodine -certain medicines for depression like amitriptyline, fluoxetine, sertraline -certain medicines for fungal infections like ketoconazole, itraconazole, voriconazole -certain medicines for migraine headache like almotriptan, eletriptan, frovatriptan, naratriptan, rizatriptan, sumatriptan, zolmitriptan -certain medicines for nausea or vomiting like dolasetron, ondansetron, palonosetron -certain medicines for Parkinson's disease like benztropine, trihexyphenidyl -certain medicines for seizures like phenobarbital, phenytoin, primidone -certain medicines for stomach problems like dicyclomine, hyoscyamine -certain medicines for travel sickness like scopolamine -diuretics -general anesthetics like halothane, isoflurane, methoxyflurane, propofol -ipratropium -local anesthetics like lidocaine, pramoxine, tetracaine -MAOIs like Carbex, Eldepryl, Marplan, Nardil, and Parnate -medicines that relax muscles for surgery -methylene blue -nilotinib -other medicines with acetaminophen -other narcotic medicines for pain or cough -phenothiazines like chlorpromazine, mesoridazine,  prochlorperazine, thioridazine This list may not describe all possible interactions. Give your health care provider a list of all the medicines, herbs, non-prescription drugs, or dietary supplements you use. Also tell them if you smoke, drink alcohol, or use illegal drugs. Some items may interact with your medicine. What should I watch for while using this medicine? Tell your doctor or health care professional if your pain does not go away, if it gets worse, or if you have new or a different type of pain. You may develop tolerance to the medicine. Tolerance means that you will need a higher dose of the medication for pain relief. Tolerance is normal and is expected if you take this medicine for a long time. Do not suddenly stop taking your medicine because you may develop a severe reaction. Your body becomes used to the medicine. This does NOT mean you are addicted. Addiction is a behavior related to getting and using a drug for a non-medical reason. If you have pain, you have a medical reason to take pain medicine. Your doctor will tell you how much medicine to take. If your doctor wants you to stop the medicine, the dose will be slowly lowered over time to avoid any side effects. There are different types of narcotic medicines (opiates). If you take more than one type at the same time or if you are taking another medicine that also causes drowsiness, you may have more side effects. Give your health care provider a list of all medicines you use. Your doctor will tell you how much medicine to take. Do not take more medicine than directed. Call emergency for  help if you have problems breathing or unusual sleepiness. Do not take other medicines that contain acetaminophen with this medicine. Always read labels carefully. If you have questions, ask your doctor or pharmacist. If you take too much acetaminophen get medical help right away. Too much acetaminophen can be very dangerous and cause liver damage. Even if  you do not have symptoms, it is important to get help right away. You may get drowsy or dizzy. Do not drive, use machinery, or do anything that needs mental alertness until you know how this medicine affects you. Do not stand or sit up quickly, especially if you are an older patient. This reduces the risk of dizzy or fainting spells. Alcohol may interfere with the effect of this medicine. Avoid alcoholic drinks. The medicine will cause constipation. Try to have a bowel movement at least every 2 to 3 days. If you do not have a bowel movement for 3 days, call your doctor or health care professional. Your mouth may get dry. Chewing sugarless gum or sucking hard candy, and drinking plenty or water may help. Contact your doctor if the problem does not go away or is severe. What side effects may I notice from receiving this medicine? Side effects that you should report to your doctor or health care professional as soon as possible: -allergic reactions like skin rash, itching or hives, swelling of the face, lips, or tongue -breathing problems -confusion -redness, blistering, peeling or loosening of the skin, including inside the mouth -signs and symptoms of liver injury like dark yellow or brown urine; general ill feeling or flu-like symptoms; light-colored stools; loss of appetite; nausea; right upper belly pain; unusually weak or tired; yellowing of the eyes or skin -signs and symptoms of low blood pressure like dizziness; feeling faint or lightheaded, falls; unusually weak or tired -trouble passing urine or change in the amount of urine Side effects that usually do not require medical attention (report to your doctor or health care professional if they continue or are bothersome): -constipation -dry mouth -nausea, vomiting -tiredness This list may not describe all possible side effects. Call your doctor for medical advice about side effects. You may report side effects to FDA at 1-800-FDA-1088. Where  should I keep my medicine? Keep out of the reach of children. This medicine can be abused. Keep your medicine in a safe place to protect it from theft. Do not share this medicine with anyone. Selling or giving away this medicine is dangerous and against the law. Store at room temperature between 20 and 25 degrees C (68 and 77 degrees F). This medicine may cause harm and death if it is taken by other adults, children, or pets. Return medicine that has not been used to an official disposal site. Contact the DEA at 7573663134 or your city/county government to find a site. If you cannot return the medicine, flush it down the toilet. Do not use the medicine after the expiration date. NOTE: This sheet is a summary. It may not cover all possible information. If you have questions about this medicine, talk to your doctor, pharmacist, or health care provider.  2019 Elsevier/Gold Standard (2016-10-25 15:46:38)  Post Anesthesia Home Care Instructions  Activity: Get plenty of rest for the remainder of the day. A responsible individual must stay with you for 24 hours following the procedure.  For the next 24 hours, DO NOT: -Drive a car -Advertising copywriter -Drink alcoholic beverages -Take any medication unless instructed by your physician -Make any legal decisions  or sign important papers.  Meals: Start with liquid foods such as gelatin or soup. Progress to regular foods as tolerated. Avoid greasy, spicy, heavy foods. If nausea and/or vomiting occur, drink only clear liquids until the nausea and/or vomiting subsides. Call your physician if vomiting continues.  Special Instructions/Symptoms: Your throat may feel dry or sore from the anesthesia or the breathing tube placed in your throat during surgery. If this causes discomfort, gargle with warm salt water. The discomfort should disappear within 24 hours.  If you had a scopolamine patch placed behind your ear for the management of post- operative nausea  and/or vomiting:  1. The medication in the patch is effective for 72 hours, after which it should be removed.  Wrap patch in a tissue and discard in the trash. Wash hands thoroughly with soap and water. 2. You may remove the patch earlier than 72 hours if you experience unpleasant side effects which may include dry mouth, dizziness or visual disturbances. 3. Avoid touching the patch. Wash your hands with soap and water after contact with the patch.    Regional Anesthesia Blocks  1. Numbness or the inability to move the "blocked" extremity may last from 3-48 hours after placement. The length of time depends on the medication injected and your individual response to the medication. If the numbness is not going away after 48 hours, call your surgeon.  2. The extremity that is blocked will need to be protected until the numbness is gone and the  Strength has returned. Because you cannot feel it, you will need to take extra care to avoid injury. Because it may be weak, you may have difficulty moving it or using it. You may not know what position it is in without looking at it while the block is in effect.  3. For blocks in the legs and feet, returning to weight bearing and walking needs to be done carefully. You will need to wait until the numbness is entirely gone and the strength has returned. You should be able to move your leg and foot normally before you try and bear weight or walk. You will need someone to be with you when you first try to ensure you do not fall and possibly risk injury.  4. Bruising and tenderness at the needle site are common side effects and will resolve in a few days.  5. Persistent numbness or new problems with movement should be communicated to the surgeon or the [facility] [phone] [facility] [phone]).

## 2018-12-05 NOTE — Anesthesia Preprocedure Evaluation (Signed)
Anesthesia Evaluation  Patient identified by MRN, date of birth, ID band Patient awake    Airway Mallampati: II  TM Distance: >3 FB     Dental   Pulmonary    breath sounds clear to auscultation       Cardiovascular  Rhythm:Regular Rate:Normal     Neuro/Psych    GI/Hepatic GERD  ,  Endo/Other  Hypothyroidism   Renal/GU negative Renal ROS     Musculoskeletal   Abdominal   Peds  Hematology   Anesthesia Other Findings   Reproductive/Obstetrics                             Anesthesia Physical Anesthesia Plan  ASA: III  Anesthesia Plan: General   Post-op Pain Management:    Induction: Intravenous  PONV Risk Score and Plan: Ondansetron, Dexamethasone and Midazolam  Airway Management Planned: LMA  Additional Equipment:   Intra-op Plan:   Post-operative Plan:   Informed Consent: I have reviewed the patients History and Physical, chart, labs and discussed the procedure including the risks, benefits and alternatives for the proposed anesthesia with the patient or authorized representative who has indicated his/her understanding and acceptance.     Dental advisory given  Plan Discussed with: CRNA and Anesthesiologist  Anesthesia Plan Comments:         Anesthesia Quick Evaluation

## 2018-12-05 NOTE — Progress Notes (Signed)
Assisted Dr. Green with left, ultrasound guided, popliteal block. Side rails up, monitors on throughout procedure. See vital signs in flow sheet. Tolerated Procedure well. 

## 2018-12-05 NOTE — Anesthesia Procedure Notes (Signed)
Procedure Name: LMA Insertion Date/Time: 12/05/2018 12:33 PM Performed by: Jessica Priest, CRNA Pre-anesthesia Checklist: Patient identified, Emergency Drugs available, Suction available and Patient being monitored Patient Re-evaluated:Patient Re-evaluated prior to induction Oxygen Delivery Method: Circle system utilized Preoxygenation: Pre-oxygenation with 100% oxygen Induction Type: IV induction Ventilation: Mask ventilation without difficulty LMA: LMA inserted LMA Size: 4.0 Number of attempts: 1 Airway Equipment and Method: Bite block Placement Confirmation: positive ETCO2 and breath sounds checked- equal and bilateral Tube secured with: Tape Dental Injury: Teeth and Oropharynx as per pre-operative assessment

## 2018-12-05 NOTE — Brief Op Note (Signed)
12/05/2018  4:25 PM  PATIENT:  Wendy Watkins  43 y.o. female  PRE-OPERATIVE DIAGNOSIS:  LEFT GASTROCEQUINUS DEFORMITY  POST-OPERATIVE DIAGNOSIS:  LEFT GASTROCEQUINUS DEFORMITY  PROCEDURE:  Procedure(s): GASTROC RECESSION EXTREMITY (Left) COTTON OSTEOTOMY, MEDIAL CALCANEAL SLIDE OSTEOTOMY, MANIPULATION OF THE ANKLE UNDER ANESTHESIA (Left) EVANCALCANEAL OSTEOTOMY (Left)  SURGEON:  Surgeon(s) and Role:    * Vivi Barrack, DPM - Primary    * Park Liter, DPM - Assisting  PHYSICIAN ASSISTANT:   ASSISTANTS: none   ANESTHESIA:   general  EBL:  25 mL   BLOOD ADMINISTERED:none  DRAINS: none   LOCAL MEDICATIONS USED:  NONE  SPECIMEN:  No Specimen  DISPOSITION OF SPECIMEN:  N/A  COUNTS:  YES  TOURNIQUET:   Total Tourniquet Time Documented: Thigh (Left) - 120 minutes Total: Thigh (Left) - 120 minutes   DICTATION: .Dragon Dictation  PLAN OF CARE: Discharge to home after PACU  PATIENT DISPOSITION:  PACU - hemodynamically stable.   Delay start of Pharmacological VTE agent (>24hrs) due to surgical blood loss or risk of bleeding: no

## 2018-12-05 NOTE — H&P (Signed)
Subjective: Wendy Watkins presents today to the [facility] for surgical correction of symptomatic flatfoot.  She is attempted multiple conservative care over the years for any significant resolution.  Due to this she required surgical intervention. Denies any systemic complaints such as fevers, chills, nausea, vomiting. No acute changes since last appointment, and no other complaints at this time.   Objective: AAO x3, NAD DP/PT pulses palpable 2/4 bilaterally, CRT less than 3 seconds Significant flatfoot deformities present left side.  Calcaneal valgus is present.  Equinus is present.  Subjectively she is getting pain to the arch of her foot.  There is no area pinpoint tenderness.  Ankle, subtalar joint range of motion intact. No open lesions or pre-ulcerative lesions.  No pain with calf compression, swelling, warmth, erythema  Assessment: Symptomatic flatfoot left side  Plan: -All treatment options discussed with the patient including all alternatives, risks, complications.  -I again discussed the surgery as well as postoperative course and alternatives risks and complications.  After review of the procedure she wishes to proceed.  Surgical consent form was signed. -N.p.o. since midnight -No history of blood clots or bleeding issues-we will likely do aspirin postoperatively -Should be nonweightbearing postop -She has no further questions or concerns today.  Ovid Curd, DPM

## 2018-12-05 NOTE — Anesthesia Procedure Notes (Signed)
Anesthesia Regional Block: Popliteal block   Pre-Anesthetic Checklist: ,, timeout performed, Correct Patient, Correct Site, Correct Laterality, Correct Procedure, Correct Position, site marked, Risks and benefits discussed,  Surgical consent,  Pre-op evaluation,  At surgeon's request and post-op pain management  Laterality: Left  Prep: chloraprep       Needles:   Needle Type: Stimulator Needle - 80          Additional Needles:   Procedures: Doppler guided,,,, ultrasound used (permanent image in chart),,,,   Nerve Stimulator or Paresthesia:  Response: 0.5 mA,   Additional Responses:   Narrative:  Start time: 12/05/2018 11:40 AM End time: 12/05/2018 12:00 PM Injection made incrementally with aspirations every 5 mL.  Performed by: Personally  Anesthesiologist: Dorris Singh, MD

## 2018-12-06 ENCOUNTER — Telehealth: Payer: Self-pay | Admitting: *Deleted

## 2018-12-06 NOTE — Telephone Encounter (Signed)
Pt states she had surgery and did Dr. Logan Bores want her to take an Aspirin after surgery.

## 2018-12-06 NOTE — Telephone Encounter (Signed)
Dr. Ardelle Anton states he would like pt to take a Baby Aspirin a day. I called pt and informed of Dr. Gabriel Rung orders. I asked pt how she felt and she stated okay, but her throat was a little sore. I instructed pt to perform salt water gargles.

## 2018-12-06 NOTE — Anesthesia Postprocedure Evaluation (Signed)
Anesthesia Post Note  Patient: JAILA INGARGIOLA  Procedure(s) Performed: GASTROC RECESSION EXTREMITY (Left ) COTTON OSTEOTOMY, MEDIAL CALCANEAL SLIDE OSTEOTOMY, MANIPULATION OF THE ANKLE UNDER ANESTHESIA (Left ) EVANCALCANEAL OSTEOTOMY (Left )     Patient location during evaluation: PACU Anesthesia Type: General Level of consciousness: awake Pain management: pain level controlled Vital Signs Assessment: post-procedure vital signs reviewed and stable Respiratory status: spontaneous breathing Cardiovascular status: stable Postop Assessment: no apparent nausea or vomiting Anesthetic complications: no    Last Vitals:  Vitals:   12/05/18 1725 12/05/18 1730  BP: 114/75   Pulse: 76 82  Resp: 11 14  Temp: 36.6 C   SpO2: 96% 95%    Last Pain:  Vitals:   12/06/18 1643  TempSrc:   PainSc: 0-No pain                 Bekki Tavenner

## 2018-12-07 ENCOUNTER — Telehealth: Payer: Self-pay | Admitting: *Deleted

## 2018-12-07 NOTE — Op Note (Addendum)
PATIENT:  Wendy Watkins  43 y.o. female  PRE-OPERATIVE DIAGNOSIS:  LEFT GASTROCEQUINUS DEFORMITY  POST-OPERATIVE DIAGNOSIS:  LEFT GASTROCEQUINUS DEFORMITY  PROCEDURE:  Procedure(s): GASTROC RECESSION EXTREMITY (Left) COTTON OSTEOTOMY, MEDIAL CALCANEAL SLIDE OSTEOTOMY, MANIPULATION OF THE ANKLE UNDER ANESTHESIA (Left) EVANCALCANEAL OSTEOTOMY (Left)  SURGEON:  Surgeon(s) and Role:    * Vivi Barrack, DPM - Primary    * Park Liter, DPM - Assisting  PHYSICIAN ASSISTANT:   ASSISTANTS: none   ANESTHESIA:   general  EBL:  25 mL   BLOOD ADMINISTERED:none  DRAINS: none   LOCAL MEDICATIONS USED:  NONE  SPECIMEN:  No Specimen  DISPOSITION OF SPECIMEN:  N/A  COUNTS:  YES  TOURNIQUET:   Total Tourniquet Time Documented: Thigh (Left) - 120 minutes Total: Thigh (Left) - 120 minutes   DICTATION: .Dragon Dictation  PLAN OF CARE: Discharge to home after PACU  PATIENT DISPOSITION:  PACU - hemodynamically stable.   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  She states this is been an ongoing issue for several years and she has had numerous conservative treatments including, but not limited to, shoe modifications, orthotics, potential bracing, physical therapy without any significant improvement.  Due to the ongoing nature of her symptoms she is requesting surgical intervention.  We do we discussed the reconstruction of her foot with deformity including alternatives, risks, complications.  No promises or guarantees were given the fact of the procedure and all questions were answered to the best my ability.  The procedure in detail: The patient was both verbally, visually identified by myself and nursing staff and the anesthesia staff in the preoperative  holding area.  A leg block was performed by the anesthesia staff preoperatively.  She was then transferred to the operating room via stretcher and placed on the operating table in supine position.  A well-padded thigh tourniquet was then placed by the nursing staff.  The left lower extremity was then scrubbed, prepped, draped in normal sterile fashion.  He was exsanguinated utilizing Esmarch bandage and the pneumatic thigh tourniquet was inflated to 350 mmHg.  Attention was first directed to the posterior aspect of the patient's calf.  A linear incision was made on the gastroc aponeurosis site.  Incision was puffy 2 cm general movement was made with a 15 blade scalpel to the epidermis and the dermis.  The subcutaneous tissues are bluntly dissected making sure retract all vital neurovascular structures.  The fascia overlying aponeurosis was then incised in line with the skin incision and was retracted.  At this time the aponeurosis was identified and neurovascular structures were reflected.  At this time a transverse incision was made in the aponeurosis utilizing 15 blade scalpel to reveal the underlying muscle belly.  At this time is found to be improvement in the equinus and ankle joint range of motion.  The foot was then manually manipulated in order to break up the adhesions to be present along the site. The incision was copiously irrigated with sterile saline and hemostasis was achieved.  Incision was then closed with this fascia being covered with 3-0 Monocryl as well as the subcutaneous tissues with 3 oh, 4-0 Monocryl and the skin was then closed with skin staples and Prolene.  Attention was then directed to the lateral aspect of the  calcaneus with a medial calcaneal slide.  Fluoroscopy was utilized to confirm incision placement.  An oblique incision was made distal to the Achilles tendon on the calcaneus with a 15 blade scalpel the epidermis.  Continue involving sharply basket making sure retract all  vital neurovascular structures and all bleeders were cauterized as necessary.  Dissection was then carried down which a deep incision was made along the calcaneus and a freer elevator was utilized to remove the soft tissue.  Fluoroscopy was utilized to confirm the osteotomy.  Once the appropriate position a sagittal saw was utilized to create an osteotomy obliquely within the calcaneus with the medial side.  The medial aspect is managed with an osteotome to protect the medial neurovascular bundle.  The osteotomy was then distracted and shifted laterally in a rectus position.  A guidewire for a Accumed 7.32mm x 45mm screw was then inserts was confirmed with fluoroscopy.  Once the proper position a small stab incision was made to the posterior calcaneus and blunt dissection was then carried down.  Area was then drilled and then a screw was inserted under appropriate compression.  Fluoroscopy was utilized to confirm positioning.  The guidewire was removed.  At this time there is still abduction of the foot and elected to proceed with an Evans calcaneal osteotomy.  Incision was made about a thumbs with inferior to the sinus tarsi extending from the fibula towards the fourth metatarsal base.  Incision was made with #15 with all of the epidermis and the dermis.  The subcutaneous tissues involving sharply dissected making sure retract all the neurovascular structures and all bleeders were cauterized.  At this time the peroneal sheath was identified and the peroneals were identified and were reflected inferiorly.  At this time the calcaneus was identified and a deep incision was made on the calcaneus over the area of the Evans calcaneal osteotomy.  This was confirmed under fluoroscopy multiple times.  Once identified an osteotomy was completed about 1.5 cm proximal to the calcaneocuboid joint.  Care was taken to make sure the medial hands was left intact.  After the appropriate trials were placed elected to proceed with a 6  mm Evans calcaneal graft from [facility].  This was inserted and tamped into place.  Fluoroscopy was utilized multiple times to confirm placement.  At this time there is found to be adequate reduction of the deformity.  At this time, an osteotomy was then performed.  Fluoroscopy was utilized in order to identify the medial cuneiform.  Incision was made in a linear fashion along medial cuneiform.  Incision was made with #15 the scalpel of the epidermis and the dermis.  The subcutaneous tissues on bluntly sharply dissected making sure retract all vital neurovascular structures and all bleeders were cauterized as necessary.  A deep incision was made on the medial cuneiform.  Utilizing fluoroscopy a sagittal bone saw was utilized to make a transverse osteotomy of the medial cuneiform measured with the inferior portion intact.  At this time I tried sizers for the cotton osteotomy and elected to proceed with a number 5 mm wedge.  After the incision was irrigated bone which was then inserted and tamped into place.    At this time fluoroscopy was utilized and found to have a good correction and there was an arch that had reformed.  Ankle, subtalar joint range of motion was intact.  At this time is on the adequate correction of the deformity.  All incisions were copiously irrigated with sterile saline  and hemostasis was achieved.   At this time proceeded with wound closure.  Incisions were copiously irrigated with sterile saline and hemostasis was achieved.  The posterior incision was closed with skin staples.  Medial and dorsal incisions were closed with a deep structures closed with 3-0 Vicryl as well as subcutaneous tissues with 4-0 Monocryl and the skin was then closed with skin staples.  Xeroform, Adaptic was applied over the incisions followed by dry sterile dressing.  During the procedure tourniquet was released for the cotton osteotomy once it reached 120 minutes.  Hemostasis was achieved.  She was woken  from anesthesia and found to tolerate the procedure well and complications.  She was transferred to PACU vital signs stable and vascular status intact.  Ovid CurdMatthew Wagoner, DPM

## 2018-12-07 NOTE — Telephone Encounter (Signed)
Called and spoke with the patient and the patient stated that the pain was worse today due to the numbness is wearing off and the bandage is not too tight and I have been icing and elevating and there has been no fever or chills and no nausea and I did take 1/2 tablet of the pain medicine and I stated to the patient to call the [facility] office at [phone] if any concerns or questions and I also stated that we would see patient next week for surgery follow up appointment. Misty Stanley

## 2018-12-10 ENCOUNTER — Other Ambulatory Visit: Payer: Self-pay

## 2018-12-10 ENCOUNTER — Ambulatory Visit (INDEPENDENT_AMBULATORY_CARE_PROVIDER_SITE_OTHER)

## 2018-12-10 ENCOUNTER — Encounter: Payer: Self-pay | Admitting: Podiatry

## 2018-12-10 ENCOUNTER — Ambulatory Visit (INDEPENDENT_AMBULATORY_CARE_PROVIDER_SITE_OTHER): Admitting: Podiatry

## 2018-12-10 VITALS — Temp 97.2°F

## 2018-12-10 DIAGNOSIS — M2142 Flat foot [pes planus] (acquired), left foot: Secondary | ICD-10-CM

## 2018-12-10 DIAGNOSIS — M76821 Posterior tibial tendinitis, right leg: Secondary | ICD-10-CM

## 2018-12-10 DIAGNOSIS — M76822 Posterior tibial tendinitis, left leg: Secondary | ICD-10-CM | POA: Diagnosis not present

## 2018-12-10 DIAGNOSIS — Z09 Encounter for follow-up examination after completed treatment for conditions other than malignant neoplasm: Secondary | ICD-10-CM

## 2018-12-10 NOTE — Progress Notes (Signed)
Subjective: Wendy Watkins is a 43 y.o. is seen today in office s/p right foot flatfoot reconstruction preformed on 12/05/2018.  Overall physically she is doing well.  The pain medication for couple doses.  She still mechanically been nonweightbearing.  Denies any systemic complaints such as fevers, chills, nausea, vomiting. No calf pain, chest pain, shortness of breath.   Objective: General: No acute distress, AAOx3  DP/PT pulses palpable 2/4, CRT < 3 sec to all digits.  Protective sensation intact. Motor function intact.  LEFT foot: Incision is well coapted without any evidence of dehiscence and still is notable intact. There is no surrounding erythema, ascending cellulitis, fluctuance, crepitus, malodor, drainage/purulence. There is mild to moderate edema around the surgical site. There is minimal pain along the surgical site.  Incision appears to be healing well without signs of infection. No other areas of tenderness to bilateral lower extremities.  No other open lesions or pre-ulcerative lesions.  No pain with calf compression, swelling, warmth, erythema.   Assessment and Plan:  Status post left flatfoot reconstruction, doing well with no complications   -Treatment options discussed including all alternatives, risks, and complications -X-rays were reviewed.  Status post CT uppercase osteotomy with Cotton, as osteotomy with graft.  Evidence of acute fracture. -And ointment as applied.  Using clean, dry, intact.  Return to the cam boot today given the swelling.  Discussed to wear the cam boot at all times. -Ice/elevation -Continue nonweightbearing either -81 mg ASA daily -Pain medication as needed. -Monitor for any clinical signs or symptoms of infection and DVT/PE and directed to call the office immediately should any occur or go to the ER. -Follow-up as scheduled for possible removal or sooner if any problems arise. In the meantime, encouraged to call the office with any questions,  concerns, change in symptoms.   [name], DPM  *As an aide after the surgery with anesthesiologist stated that the patient and refused a nerve block because she was not informed and educated on this by me or or office.  I did discuss this with patient today and she felt that she is adequately informed she had no questions about the block and she was amenable to doing this.  She felt that we had talked and she did not feel that she no educated on this.

## 2018-12-11 ENCOUNTER — Encounter (HOSPITAL_BASED_OUTPATIENT_CLINIC_OR_DEPARTMENT_OTHER): Payer: Self-pay | Admitting: Podiatry

## 2018-12-20 ENCOUNTER — Ambulatory Visit (INDEPENDENT_AMBULATORY_CARE_PROVIDER_SITE_OTHER): Admitting: Podiatry

## 2018-12-20 ENCOUNTER — Other Ambulatory Visit: Payer: Self-pay

## 2018-12-20 ENCOUNTER — Encounter: Payer: Self-pay | Admitting: Podiatry

## 2018-12-20 VITALS — Temp 98.0°F

## 2018-12-20 DIAGNOSIS — M76821 Posterior tibial tendinitis, right leg: Secondary | ICD-10-CM

## 2018-12-20 DIAGNOSIS — M2142 Flat foot [pes planus] (acquired), left foot: Secondary | ICD-10-CM | POA: Diagnosis not present

## 2018-12-20 DIAGNOSIS — M76822 Posterior tibial tendinitis, left leg: Secondary | ICD-10-CM

## 2018-12-20 NOTE — Progress Notes (Signed)
Subjective: Wendy Watkins is a 43 y.o. is seen today in office s/p right foot flatfoot reconstruction preformed on 12/05/2018.  She states that she is doing well.  She denies any pain she takes daily ibuprofen at nighttime as needed but not daily.  She is been nonweightbearing with Cam boot.  She denies any fevers, chills, nausea, vomiting.  No calf pain, chest pain, shortness of breath.   Objective: General: No acute distress, AAOx3  DP/PT pulses palpable 2/4, CRT < 3 sec to all digits.  Protective sensation intact. Motor function intact.  LEFT foot: Incision is well coapted without any evidence of dehiscence and sutures, staples are intact.  There is decreased edema to the foot.  Mild discomfort mostly along the calcaneus on the incision but no other areas of tenderness.  Incisions appear to be healing well but any drainage or pus or any signs of infection.  No other open lesions or pre-ulcerative lesions.  No pain with calf compression, swelling, warmth, erythema.   Assessment and Plan:  Status post left flatfoot reconstruction, doing well with no complications   -Treatment options discussed including all alternatives, risks, and complications -Every other staple was removed today.  Incision remained well coapted the left remainder of the sutures, staples intact to ensure healing.  Antibiotic ointment and a bandage was applied.  Today well-padded below-knee fiberglass cast was applied making sure to pad all bony prominences. -Ice elevation -Pain medication as needed -Continue nonweightbearing  Return in about 10 days (around 12/30/2018) for [facility] patient.  Cast change, x-rays  Trula Slade DPM

## 2018-12-28 ENCOUNTER — Other Ambulatory Visit: Payer: Self-pay

## 2018-12-28 ENCOUNTER — Ambulatory Visit (INDEPENDENT_AMBULATORY_CARE_PROVIDER_SITE_OTHER): Admitting: Podiatry

## 2018-12-28 VITALS — Temp 97.9°F

## 2018-12-28 DIAGNOSIS — M76822 Posterior tibial tendinitis, left leg: Secondary | ICD-10-CM

## 2018-12-28 DIAGNOSIS — M2142 Flat foot [pes planus] (acquired), left foot: Secondary | ICD-10-CM

## 2018-12-28 DIAGNOSIS — M76821 Posterior tibial tendinitis, right leg: Secondary | ICD-10-CM

## 2018-12-30 NOTE — Progress Notes (Signed)
Subjective: Wendy Watkins is a 43 y.o. is seen today in office s/p right foot flatfoot reconstruction preformed on 12/05/2018.  Overall she states is been doing well.  States the cast is been she feels that there is been a sharp intermittent pain that occurs at times.  It is not all the time.  She has no recent injury.  No falls.  She presents today for cast change, staple removal. She denies any fevers, chills, nausea, vomiting.  No calf pain, chest pain, shortness of breath.   Objective: General: No acute distress, AAOx3  DP/PT pulses palpable 2/4, CRT < 3 sec to all digits.  Protective sensation intact. Motor function intact.  LEFT foot: Cast is clean, dry, intact.  Incision is well coapted without any evidence of dehiscence and sutures, staples are intact.  There is mild as well as improved edema to the foot.  Mild however improving discomfort mostly along the calcaneus on the incision but no other areas of tenderness.  Incisions appear to be healing well but any drainage or pus or any signs of infection.  No other open lesions or pre-ulcerative lesions.  No pain with calf compression, swelling, warmth, erythema.   Assessment and Plan:  Status post left flatfoot reconstruction  -Treatment options discussed including all alternatives, risks, and complications -The remainder of the staples were removed today without complications as well as the sutures.  Incisions appear to be healing well.  Antibiotic ointment and a bandage was applied.  A new well-padded below-knee fiberglass cast was applied making sure to pad all bony prominences. -Continue nonweightbearing -Ice elevation -Pain medication as needed  RTC in 2 weeks.  At that time repeat x-rays and transition to a cam boot and start ankle range of motion exercises.   Trula Slade DPM

## 2019-01-15 ENCOUNTER — Ambulatory Visit (INDEPENDENT_AMBULATORY_CARE_PROVIDER_SITE_OTHER)

## 2019-01-15 ENCOUNTER — Other Ambulatory Visit: Payer: Self-pay

## 2019-01-15 ENCOUNTER — Ambulatory Visit (INDEPENDENT_AMBULATORY_CARE_PROVIDER_SITE_OTHER): Payer: Self-pay | Admitting: Podiatry

## 2019-01-15 DIAGNOSIS — Z09 Encounter for follow-up examination after completed treatment for conditions other than malignant neoplasm: Secondary | ICD-10-CM

## 2019-01-15 DIAGNOSIS — M216X1 Other acquired deformities of right foot: Secondary | ICD-10-CM | POA: Diagnosis not present

## 2019-01-15 DIAGNOSIS — M216X2 Other acquired deformities of left foot: Secondary | ICD-10-CM

## 2019-01-15 DIAGNOSIS — M76822 Posterior tibial tendinitis, left leg: Secondary | ICD-10-CM

## 2019-01-15 DIAGNOSIS — M76821 Posterior tibial tendinitis, right leg: Secondary | ICD-10-CM | POA: Diagnosis not present

## 2019-01-18 ENCOUNTER — Ambulatory Visit

## 2019-01-23 NOTE — Progress Notes (Signed)
Subjective: Wendy Watkins is a 43 y.o. is seen today in office s/p right foot flatfoot reconstruction preformed on 12/05/2018.  Overall she states that she is doing well and her foot feels better.  She is ready for the cast to come off.  Denies any recent injury or falls.  She has no new concerns.  She denies any fevers, chills, nausea, vomiting.  No calf pain, chest pain, shortness of breath.   Objective: General: No acute distress, AAOx3  DP/PT pulses palpable 2/4, CRT < 3 sec to all digits.  Protective sensation intact. Motor function intact.  LEFT foot: Cast is clean, dry, intact.  Incision is well coapted without any evidence of dehiscence and scars are well formed.  Dry skin is present.  Incisions are healing well without any signs of infection or dehiscence.  No open lesions identified otherwise.  There is decreased tenderness palpation of the surgical sites. No other open lesions or pre-ulcerative lesions.  No pain with calf compression, swelling, warmth, erythema.   Assessment and Plan:  Status post left flatfoot reconstruction-doing well  -Treatment options discussed including all alternatives, risks, and complications -X-rays obtained reviewed.  There appears to be increased consolidation across the bone graft as well as surgical sites.  No evidence of acute fracture.  Hardware intact the calcaneus.  Difficult to evaluate the position given the x-ray positioning. -Today she is placed into a cam boot.  We discussed partial weightbearing for now.  Discussed gentle range of motion exercises with ankle as well the digits.  Continue to ice elevate.  Anti-inflammatories as needed.  Discussed physical therapy. -She can start to shower as well and recommend antibiotic ointment to the incisions as well as residual feet. -Monitor for any clinical signs or symptoms of infection and directed to call the office immediately should any occur or go to the ER.  Follow-up scheduled.  Repeat x-rays next  appointment.  Trula Slade DPM

## 2019-01-29 ENCOUNTER — Encounter: Payer: Self-pay | Admitting: Podiatry

## 2019-01-29 ENCOUNTER — Ambulatory Visit (INDEPENDENT_AMBULATORY_CARE_PROVIDER_SITE_OTHER): Admitting: Podiatry

## 2019-01-29 ENCOUNTER — Other Ambulatory Visit: Payer: Self-pay

## 2019-01-29 ENCOUNTER — Ambulatory Visit

## 2019-01-29 ENCOUNTER — Ambulatory Visit (INDEPENDENT_AMBULATORY_CARE_PROVIDER_SITE_OTHER)

## 2019-01-29 DIAGNOSIS — Z09 Encounter for follow-up examination after completed treatment for conditions other than malignant neoplasm: Secondary | ICD-10-CM

## 2019-01-29 DIAGNOSIS — M216X1 Other acquired deformities of right foot: Secondary | ICD-10-CM

## 2019-01-29 DIAGNOSIS — M216X2 Other acquired deformities of left foot: Secondary | ICD-10-CM

## 2019-02-05 ENCOUNTER — Encounter: Payer: Self-pay | Admitting: Podiatry

## 2019-02-05 NOTE — Progress Notes (Signed)
Subjective: Wendy Watkins is a 43 y.o. is seen today in office s/p right foot flatfoot reconstruction preformed on 12/05/2018.  She states that she is doing well she is not having any significant discomfort.  She has been walking in the boot.  She feels the outside aspect of her foot first been hard.  No significant pain with it.  Swelling is improving.  No recent injury or falls.  She denies any fevers, chills, nausea, vomiting.  No calf pain, chest pain, shortness of breath.   Objective: General: No acute distress, AAOx3  DP/PT pulses palpable 2/4, CRT < 3 sec to all digits.  Protective sensation intact. Motor function intact.  LEFT foot: Cast is clean, dry, intact.  Incision is well coapted without any evidence of dehiscence and scars are well formed.  There is some scar tissue on the lateral aspect of the heel this is where she has the "hard" sensation.  No significant discomfort at the surgical site. No other open lesions or pre-ulcerative lesions.  No pain with calf compression, swelling, warmth, erythema.   Assessment and Plan:  Status post left flatfoot reconstruction-doing well  -Treatment options discussed including all alternatives, risks, and complications -X-rays obtained reviewed.  There appears to be increased consolidation across the bone graft as well as surgical sites.  There is no evidence of acute fracture identified today.  Hardware intact without any breakage. -Continue cam boot and weightbearing in cam boot.  Also ordered start physical therapy.  A prescription for [facility] was written.  Continue to ice elevate. -Can stop the ASA -NSAIDs prn  Return in about 4 weeks (around 02/26/2019).  Trula Slade DPM

## 2019-02-06 ENCOUNTER — Encounter: Payer: Self-pay | Admitting: Podiatry

## 2019-02-26 ENCOUNTER — Ambulatory Visit (INDEPENDENT_AMBULATORY_CARE_PROVIDER_SITE_OTHER): Payer: Self-pay | Admitting: Podiatry

## 2019-02-26 ENCOUNTER — Other Ambulatory Visit: Payer: Self-pay

## 2019-02-26 ENCOUNTER — Ambulatory Visit (INDEPENDENT_AMBULATORY_CARE_PROVIDER_SITE_OTHER)

## 2019-02-26 VITALS — Temp 98.0°F

## 2019-02-26 DIAGNOSIS — M2142 Flat foot [pes planus] (acquired), left foot: Secondary | ICD-10-CM

## 2019-02-26 DIAGNOSIS — M216X2 Other acquired deformities of left foot: Secondary | ICD-10-CM

## 2019-02-26 DIAGNOSIS — M216X1 Other acquired deformities of right foot: Secondary | ICD-10-CM

## 2019-02-27 ENCOUNTER — Encounter: Payer: Self-pay | Admitting: Podiatry

## 2019-03-01 ENCOUNTER — Encounter: Payer: Self-pay | Admitting: Podiatry

## 2019-03-03 NOTE — Progress Notes (Signed)
Subjective: Wendy Watkins is a 43 y.o. is seen today in office s/p right foot flatfoot reconstruction preformed on 12/05/2018.  She states that she is doing well.  She is on her feet quite a bit yesterday shopping and she had some pain.  Other than that she is been doing well and her pain is better today.  No recent injury or falls.  Physical therapy.  She been walking with a regular shoe with an ankle brace.  She is interested in having the surgery for the contralateral extremity this year. She denies any fevers, chills, nausea, vomiting.  No calf pain, chest pain, shortness of breath.   Objective: General: No acute distress, AAOx3  DP/PT pulses palpable 2/4, CRT < 3 sec to all digits.  Protective sensation intact. Motor function intact.  LEFT foot: Cast is clean, dry, intact.  Incision is well coapted without any evidence of dehiscence and scars are well formed.  There is some scar tissue formation to the lateral aspect of the foot, surgical sites.  This is where she has marked discomfort but overall there is no specific tenderness no pain is mild.  Minimal edema to the surgical sites.  No erythema or warmth.  No other open lesions or pre-ulcerative lesions.  No pain with calf compression, swelling, warmth, erythema.   Assessment and Plan:  Status post left flatfoot reconstruction-doing well  -Treatment options discussed including all alternatives, risks, and complications -X-rays obtained reviewed.  There appears to be increased consolidation across the bone graft as well as surgical sites.  There is no evidence of acute fracture identified today.  Hardware intact without any breakage. -Continue physical therapy.  Continue with regular shoe with ankle brace.  Gradual increase activity level. -Compound cream for scar formation ordered through [facility].  -Discussed surgery for the contralateral extremity later this year but will make sure she is comfortable on the left side before  proceeding with the right side.  Return in about 4 weeks (around 03/26/2019).  Trula Slade DPM

## 2019-03-04 MED ORDER — NONFORMULARY OR COMPOUNDED ITEM: 11 refills | Status: DC

## 2019-03-04 NOTE — Telephone Encounter (Signed)
Received request from Dr. Jacqualyn Posey 03/04/2019 to order [facility] - Scar Cream.

## 2019-03-20 ENCOUNTER — Encounter: Payer: Self-pay | Admitting: Podiatry

## 2019-03-21 ENCOUNTER — Encounter (INDEPENDENT_AMBULATORY_CARE_PROVIDER_SITE_OTHER): Payer: Self-pay | Admitting: Internal Medicine

## 2019-03-27 ENCOUNTER — Other Ambulatory Visit: Payer: Self-pay

## 2019-03-27 ENCOUNTER — Encounter (INDEPENDENT_AMBULATORY_CARE_PROVIDER_SITE_OTHER): Payer: Self-pay | Admitting: Internal Medicine

## 2019-03-27 ENCOUNTER — Ambulatory Visit (INDEPENDENT_AMBULATORY_CARE_PROVIDER_SITE_OTHER): Admitting: Internal Medicine

## 2019-03-27 VITALS — BP 110/70 | HR 72 | Ht 61.0 in | Wt 241.0 lb

## 2019-03-27 DIAGNOSIS — H93A1 Pulsatile tinnitus, right ear: Secondary | ICD-10-CM

## 2019-03-27 NOTE — Progress Notes (Signed)
Wellness Office Visit  Subjective:  Patient ID: Wendy Watkins, female    DOB: [birth year]  Age: 43 y.o. MRN: [id number]  CC: This lady comes in for an acute visit with chief complaint of right pulsatile tinnitus. HPI  She tells me that she has had the symptoms for the last 1 week.  She denies any fever, deafness in the right ear.  She has had a frontal headache.  She denies any dizziness, speech difficulties, any other focal neurological symptoms.  She has never had this in her life before.  She denies any significant nasal congestion or sneezing although she has had some sneezing. Past Medical History:  Diagnosis Date  . Anxiety   . Arthritis    hip  . Chiari I malformation ([place])   . Eczema   . GERD (gastroesophageal reflux disease)   . Heavy menstrual period   . History of iron deficiency anemia   . Hypothyroidism   . Migraines   . Obesity   . PMS (premenstrual syndrome) 10/11/2016  . Urge incontinence   . Wears contact lenses   . Wears glasses       Family History  Problem Relation Age of Onset  . Hyperlipidemia Mother   . Hypertension Mother   . Arthritis Father   . Cancer Father        lymphoma  . Heart disease Maternal Grandmother        CHF  . Hyperlipidemia Maternal Grandmother   . Hypertension Maternal Grandmother   . Diabetes Maternal Grandmother   . Cancer Paternal Grandmother        ovarian  . Kidney disease Paternal Grandfather   . 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 Cholecalciferol (VITAMIN D3 PO) Take 10,000 Units by mouth daily.   . clobetasol ointment (TEMOVATE) 9.38 % Apply 1 application topically as needed.  Marland Kitchen FLUoxetine (PROZAC) 20 MG tablet Take 1 tablet (20 mg total) by mouth daily.  Week prior to period. (Patient taking differently: Take 20 mg by mouth every evening. Week prior to period. )  . ibuprofen (ADVIL) 800 MG tablet Take 1 tablet (800 mg total) by mouth every 8 (eight) hours as needed.  Marland Kitchen levonorgestrel (MIRENA) 20 MCG/24HR IUD by Intrauterine route.  . NONFORMULARY OR COMPOUNDED ITEM [facility]:  Scar Cream - Verapamil 10%, Pentoxifylline 5%, apply 1-2 grams to affected area 3-4 times daily  . NP THYROID 120 MG tablet Take 120 mg by mouth daily before breakfast.       Objective:   Today's Vitals: BP 110/70   Pulse 72   Ht 5\' 1"  (1.549 m)   Wt 241 lb (109.3 kg)   BMI 45.54 kg/m  Vitals with BMI 03/27/2019 12/05/2018 12/05/2018  Height 5\' 1"  - -  Weight 241 lbs - -  BMI 10.17 - -  Systolic 510 - 258  Diastolic 70 - 75  Pulse 72 82 76     Physical Exam  She looks systemically well.  She is alert and orientated without any focal neurological signs.  There are no cerebellar signs.  There is no nystagmus.  Examination of the right ear canal is normal with  normal tympanic membrane with no evidence of infection or inflammation.  The left side is also normal.     Assessment   1. Pulsatile tinnitus of right ear       Tests ordered Orders Placed This Encounter  Procedures  . Ambulatory referral to ENT     Plan: 1. I am not sure as to the cause of her pulsatile tinnitus.  I will refer her to ENT first and seek further evaluation.     Wilson Singer, MD

## 2019-03-28 ENCOUNTER — Ambulatory Visit (INDEPENDENT_AMBULATORY_CARE_PROVIDER_SITE_OTHER): Admitting: Podiatry

## 2019-03-28 DIAGNOSIS — M2141 Flat foot [pes planus] (acquired), right foot: Secondary | ICD-10-CM

## 2019-03-28 DIAGNOSIS — M2142 Flat foot [pes planus] (acquired), left foot: Secondary | ICD-10-CM | POA: Diagnosis not present

## 2019-03-28 NOTE — Patient Instructions (Signed)

## 2019-03-28 NOTE — Progress Notes (Signed)
Subjective: Wendy Watkins is a 43 y.o. is seen today in office s/p right foot flatfoot reconstruction preformed on 12/05/2018.  She states that overall she is doing well and is ready to have surgery on the right foot.  She is back to regular shoe and doing normal activities.  She states that she feels a lot better compared to prior to surgery.  She is been using scar cream and she seems mild improvement so far.  She was going proceed with surgery in the right foot.   Objective: General: No acute distress, AAOx3  DP/PT pulses palpable 2/4, CRT < 3 sec to all digits.  Protective sensation intact. Motor function intact.  LEFT foot: Incisions are all well-healed however there is hypertrophic scars present.  No significant discuss the surgical site. RIGHT foot: Palpable points present.  Equinus is present.  Ankle, subtalar range of motion intact without any restrictions.  Tenderness along the arch of the foot and starting on medial aspect of foot.  Posterior tibial tendon appears to be intact.  Achilles tendon. No other open lesions or pre-ulcerative lesions.  No pain with calf compression, swelling, warmth, erythema.   Assessment and Plan:  Status post left flatfoot reconstruction-doing well; right foot foot  -Treatment options discussed including all alternatives, risks, and complications -Regards to left foot she is doing well.  Continue with the supportive shoes ankle brace.  She still doing physical therapy.  She can start to start home physical therapy as she feels that she can do the same thing at home as she is plateauing with therapy.  Continue with supportive shoe gear. -On the right foot discussed surgical intervention.  She wished to proceed.  We will do the same surgeon the right foot including gastrocnemius recession, medial calcaneal slide, Evans, cotton osteotomy. -The incision placement as well as the postoperative course was discussed with the patient. I discussed risks of the surgery  which include, but not limited to, infection, bleeding, pain, swelling, need for further surgery, delayed or nonhealing, painful or ugly scar, numbness or sensation changes, over/under correction, recurrence, transfer lesions, further deformity, hardware failure, DVT/PE, loss of toe/foot. Patient understands these risks and wishes to proceed with surgery. The surgical consent was reviewed with the patient all 3 pages were signed. No promises or guarantees were given to the outcome of the procedure. All questions were answered to the best of my ability. Before the surgery the patient was encouraged to call the office if there is any further questions. The surgery will be performed at the [facility] on an outpatient basis.  Trula Slade DPM

## 2019-04-16 ENCOUNTER — Other Ambulatory Visit: Payer: Self-pay

## 2019-04-16 ENCOUNTER — Encounter (INDEPENDENT_AMBULATORY_CARE_PROVIDER_SITE_OTHER): Payer: Self-pay | Admitting: Internal Medicine

## 2019-04-16 ENCOUNTER — Ambulatory Visit (INDEPENDENT_AMBULATORY_CARE_PROVIDER_SITE_OTHER): Admitting: Internal Medicine

## 2019-04-16 VITALS — BP 128/72 | HR 73 | Temp 97.9°F | Resp 18 | Ht 65.0 in | Wt 239.0 lb

## 2019-04-16 DIAGNOSIS — Z0001 Encounter for general adult medical examination with abnormal findings: Secondary | ICD-10-CM

## 2019-04-16 DIAGNOSIS — E559 Vitamin D deficiency, unspecified: Secondary | ICD-10-CM | POA: Diagnosis not present

## 2019-04-16 DIAGNOSIS — Z6839 Body mass index (BMI) 39.0-39.9, adult: Secondary | ICD-10-CM | POA: Diagnosis not present

## 2019-04-16 DIAGNOSIS — N943 Premenstrual tension syndrome: Secondary | ICD-10-CM | POA: Diagnosis not present

## 2019-04-16 DIAGNOSIS — E6609 Other obesity due to excess calories: Secondary | ICD-10-CM

## 2019-04-16 DIAGNOSIS — E782 Mixed hyperlipidemia: Secondary | ICD-10-CM | POA: Diagnosis not present

## 2019-04-16 DIAGNOSIS — E282 Polycystic ovarian syndrome: Secondary | ICD-10-CM

## 2019-04-16 NOTE — Progress Notes (Signed)
Chief Complaint: This 43 year old lady comes in for an annual physical exam and to address her chronic conditions which are described below. HPI: She has a diagnosis of PCOS, obesity, PMS, hyperlipidemia, vitamin D deficiency. We will try to work to help her lose weight to reduce insulin resistance for her PCOS.  This has been done by combination of nutrition and exercise as well as optimization of hormones. Prozac seems to help her PMS symptoms and she continues to take this every day. She is due to have upcoming right foot surgery for flat feet and her main symptom is foot pain.  She needs medical clearance for the surgery to take place.  Past Medical History:  Diagnosis Date  . Anxiety   . Arthritis    hip  . Chiari I malformation ([place])   . Eczema   . GERD (gastroesophageal reflux disease)   . Heavy menstrual period   . History of iron deficiency anemia   . Migraines   . Obesity   . PMS (premenstrual syndrome) 10/11/2016  . Urge incontinence   . Wears contact lenses   . Wears glasses    Past Surgical History:  Procedure Laterality Date  . CALCANEAL OSTEOTOMY Left 12/05/2018   Procedure: EVANCALCANEAL OSTEOTOMY;  Surgeon: Trula Slade, DPM;  Location: [place];  Service: Podiatry;  Laterality: Left;  Marland Kitchen GASTROC RECESSION EXTREMITY Left 12/05/2018   Procedure: GASTROC RECESSION EXTREMITY;  Surgeon: Trula Slade, DPM;  Location: [place];  Service: Podiatry;  Laterality: Left;  . OSTECTOMY Left 12/05/2018   Procedure: COTTON OSTEOTOMY, MEDIAL CALCANEAL SLIDE OSTEOTOMY, MANIPULATION OF THE ANKLE UNDER ANESTHESIA;  Surgeon: Trula Slade, DPM;  Location: [place];  Service: Podiatry;  Laterality: Left;  . TUBAL LIGATION  2003  . 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 Other Anaphylaxis    Peaches: throat swelling     Current Meds  Medication Sig  . Cholecalciferol (VITAMIN D3 PO) Take 10,000 Units by mouth daily.   Marland Kitchen FLUoxetine (PROZAC) 20 MG tablet Take 1 tablet (20 mg total) by mouth daily. Week prior to period. (Patient taking differently: Take 20 mg by mouth every evening. Week prior to period. )  . levonorgestrel (MIRENA) 20 MCG/24HR IUD by Intrauterine route.  . NP THYROID 120 MG tablet Take 120 mg by mouth daily before breakfast.      Bio-identical hormones This patient is being treated with desiccated thyroid, off label, for symptoms of thyroid deficiency.  The patient has been counseled regarding side effects and how to deal with them.  ZHG:DJMEQ from the symptoms mentioned above,there are no other symptoms referable to all systems reviewed.  Physical Exam: Blood pressure 128/72, pulse 73, temperature 97.9 F (36.6 C), temperature source Temporal, resp. rate 18, height 5\' 5"  (1.651 m), weight 239 lb (108.4 kg), SpO2 98 %. Vitals with BMI 04/16/2019 03/27/2019 12/05/2018  Height 5\' 5"  5\' 1"  -  Weight 239 lbs 241 lbs -  BMI 68.34 19.62 -  Systolic 229 798 -  Diastolic 72 70 -  Pulse 73 72 82      She  looks systemically well but is obese, almost morbidly obese. General: Alert, cooperative, and appears to be the stated age.No pallor.  No jaundice.  No clubbing. Head: Normocephalic Eyes: Sclera white, pupils equal and reactive to light, red reflex x 2,  Ears: Normal bilaterally Oral cavity: Lips, mucosa, and tongue normal: Teeth and gums normal Neck: No adenopathy, supple, symmetrical, trachea midline, and thyroid does not appear enlarged Respiratory: Clear to auscultation bilaterally.No wheezing, crackles or bronchial breathing. Cardiovascular: Heart sounds are present and appear to be normal without murmurs or added  sounds.  No carotid bruits.  Peripheral pulses are present and equal bilaterally.: Gastrointestinal:positive bowel sounds, no hepatosplenomegaly.  No masses felt.No tenderness. Skin: Clear, No rashes noted.No worrisome skin lesions seen. Neurological: Grossly intact without focal findings, cranial nerves II through XII intact, muscle strength equal bilaterally Musculoskeletal: No acute joint abnormalities noted.Full range of movement noted with joints. Psychiatric: Affect appropriate, non-anxious.    Assessment  1. Encounter for general adult medical examination with abnormal findings   2. PMS (premenstrual syndrome)   3. Class 2 obesity due to excess calories without serious comorbidity with body mass index (BMI) of 39.0 to 39.9 in adult   4. Vitamin D deficiency disease   5. Mixed hyperlipidemia   6. PCOS (polycystic ovarian syndrome)     Tests Ordered:   Orders Placed This Encounter  Procedures  . CBC  . COMPLETE METABOLIC PANEL WITH GFR  . Hemoglobin A1c  . Lipid panel  . T3, free  . TSH     Plan  1. She will continue Prozac for her PMS. 2. We will continue to work with her regarding her obesity and to reduce weight. 3. She will continue with vitamin D3 10,000 units daily and levels previously were in a good range. 4. Her hyperlipidemia thankfully has not required statin therapy but she will need to continue to work with nutrition. 5. Further recommendations will depend on blood results and she is medically cleared to undergo right foot surgery. 6. Today, in addition to a preventative visit, I performed an office visit to address her chronic conditions above.     No orders of the defined types were placed in this encounter.    Nimish C Gosrani   04/16/2019, 12:21 PM

## 2019-04-17 ENCOUNTER — Other Ambulatory Visit (INDEPENDENT_AMBULATORY_CARE_PROVIDER_SITE_OTHER): Payer: Self-pay | Admitting: Internal Medicine

## 2019-04-17 LAB — COMPLETE METABOLIC PANEL WITH GFR
AG Ratio: 1.3 (calc) (ref 1.0–2.5)
ALT: 7 U/L (ref 6–29)
AST: 12 U/L (ref 10–30)
Albumin: 3.9 g/dL (ref 3.6–5.1)
Alkaline phosphatase (APISO): 133 U/L — ABNORMAL HIGH (ref 31–125)
BUN: 12 mg/dL (ref 7–25)
CO2: 27 mmol/L (ref 20–32)
Calcium: 9.4 mg/dL (ref 8.6–10.2)
Chloride: 103 mmol/L (ref 98–110)
Creat: 0.9 mg/dL (ref 0.50–1.10)
GFR, Est African American: 91 mL/min/{1.73_m2} (ref >=60)
GFR, Est Non African American: 79 mL/min/{1.73_m2} (ref >=60)
Globulin: 3.1 g/dL (calc) (ref 1.9–3.7)
Glucose, Bld: 78 mg/dL (ref 65–99)
Potassium: 5.2 mmol/L (ref 3.5–5.3)
Sodium: 137 mmol/L (ref 135–146)
Total Bilirubin: 0.3 mg/dL (ref 0.2–1.2)
Total Protein: 7 g/dL (ref 6.1–8.1)

## 2019-04-17 LAB — LIPID PANEL
Cholesterol: 201 mg/dL — ABNORMAL HIGH (ref <200)
HDL: 42 mg/dL — ABNORMAL LOW (ref >=50)
LDL Cholesterol (Calc): 143 mg/dL (calc) — ABNORMAL HIGH
Non-HDL Cholesterol (Calc): 159 mg/dL (calc) — ABNORMAL HIGH (ref <130)
Total CHOL/HDL Ratio: 4.8 (calc) (ref <5.0)
Triglycerides: 69 mg/dL (ref <150)

## 2019-04-17 LAB — HEMOGLOBIN A1C
Hgb A1c MFr Bld: 5.1 % of total Hgb (ref <5.7)
Mean Plasma Glucose: 100 (calc)
eAG (mmol/L): 5.5 (calc)

## 2019-04-17 LAB — CBC
HCT: 36.6 % (ref 35.0–45.0)
Hemoglobin: 12.2 g/dL (ref 11.7–15.5)
MCH: 31 pg (ref 27.0–33.0)
MCHC: 33.3 g/dL (ref 32.0–36.0)
MCV: 93.1 fL (ref 80.0–100.0)
MPV: 9.7 fL (ref 7.5–12.5)
Platelets: 392 10*3/uL (ref 140–400)
RBC: 3.93 10*6/uL (ref 3.80–5.10)
RDW: 12.3 % (ref 11.0–15.0)
WBC: 7.4 10*3/uL (ref 3.8–10.8)

## 2019-04-17 LAB — TSH: TSH: 0.19 mIU/L — ABNORMAL LOW

## 2019-04-17 LAB — T3, FREE: T3, Free: 2.5 pg/mL (ref 2.3–4.2)

## 2019-04-17 MED ORDER — THYROID 30 MG PO TABS: 30.0000 mg | ORAL_TABLET | Freq: Every day | ORAL | 3 refills | Status: DC

## 2019-05-03 ENCOUNTER — Telehealth: Payer: Self-pay | Admitting: *Deleted

## 2019-05-03 DIAGNOSIS — Z01818 Encounter for other preprocedural examination: Secondary | ICD-10-CM

## 2019-05-03 NOTE — Telephone Encounter (Signed)
"  I'm calling to confirm about my surgery on November 4.  If you would give me a call back.  I just have some questions."  I am returning your call.  "I was calling to make sure everything was okay for my surgery on Wednesday of next week.  I haven't heard anything from anyone.  I know the last time I had surgery, I had to have a Covid test done.  I haven't done any of that."  You are scheduled for the surgery on May 08, 2019.  Your surgery starts at 12 noon.  You'll probably have to be there two hours prior to that time.  You should receive a call from a pre-admit nurse and that person will give you your exact arrival time.  I'll put the orders in for the Covid test.  Call and schedule your appointment.  The phone number to call is [phone]. "Okay, I'll call and schedule my appointment for the Covid test."  (Dr. Jacqualyn Posey, please sign the order.)

## 2019-05-04 ENCOUNTER — Other Ambulatory Visit (HOSPITAL_COMMUNITY)
Admission: RE | Admit: 2019-05-04 | Discharge: 2019-05-04 | Disposition: A | Discharge status: Home / Self Care | Source: Ambulatory Visit | Attending: Podiatry | Admitting: Podiatry

## 2019-05-04 DIAGNOSIS — Z01812 Encounter for preprocedural laboratory examination: Secondary | ICD-10-CM | POA: Insufficient documentation

## 2019-05-04 DIAGNOSIS — Z20828 Contact with and (suspected) exposure to other viral communicable diseases: Secondary | ICD-10-CM | POA: Insufficient documentation

## 2019-05-05 ENCOUNTER — Other Ambulatory Visit (INDEPENDENT_AMBULATORY_CARE_PROVIDER_SITE_OTHER): Payer: Self-pay | Admitting: Internal Medicine

## 2019-05-05 LAB — NOVEL CORONAVIRUS, NAA (HOSP ORDER, SEND-OUT TO REF LAB; TAT 18-24 HRS): SARS-CoV-2, NAA: NOT DETECTED

## 2019-05-06 ENCOUNTER — Ambulatory Visit (INDEPENDENT_AMBULATORY_CARE_PROVIDER_SITE_OTHER): Admitting: Otolaryngology

## 2019-05-06 DIAGNOSIS — H93293 Other abnormal auditory perceptions, bilateral: Secondary | ICD-10-CM | POA: Diagnosis not present

## 2019-05-06 DIAGNOSIS — H9311 Tinnitus, right ear: Secondary | ICD-10-CM

## 2019-05-07 ENCOUNTER — Other Ambulatory Visit: Payer: Self-pay

## 2019-05-07 ENCOUNTER — Encounter (HOSPITAL_BASED_OUTPATIENT_CLINIC_OR_DEPARTMENT_OTHER): Payer: Self-pay

## 2019-05-07 NOTE — Progress Notes (Addendum)
Spoke with: Dinia NPO:  After Midnight, no gum, candy, or mints   H&P: Present on chart  04/16/2019 Dr. Anastasio Champion    Arrival time: 1000 AM Labs: Hgb, UPT AM medications: Thyroid Pre op orders: Yes Ride home: Selinda Flavin (husband) [phone]

## 2019-05-07 NOTE — Progress Notes (Signed)
SPOKE W/  Geral     SCREENING SYMPTOMS OF COVID 19:   COUGH--NO  RUNNY NOSE--- NO  SORE THROAT---NO  NASAL CONGESTION----NO  SNEEZING----NO  SHORTNESS OF BREATH---NO  DIFFICULTY BREATHING---NO  TEMP >100.0 -----NO  UNEXPLAINED BODY ACHES------NO  CHILLS -------- NO  HEADACHES ---------NO  LOSS OF SMELL/ TASTE --------NO    HAVE YOU OR ANY FAMILY MEMBER TRAVELLED PAST 14 DAYS OUT OF THE   COUNTY---Lives in [facility] STATE----NO COUNTRY----NO  HAVE YOU OR ANY FAMILY MEMBER BEEN EXPOSED TO ANYONE WITH COVID 19? NO

## 2019-05-08 ENCOUNTER — Ambulatory Visit (HOSPITAL_COMMUNITY)

## 2019-05-08 ENCOUNTER — Encounter (HOSPITAL_BASED_OUTPATIENT_CLINIC_OR_DEPARTMENT_OTHER): Payer: Self-pay

## 2019-05-08 ENCOUNTER — Encounter (HOSPITAL_BASED_OUTPATIENT_CLINIC_OR_DEPARTMENT_OTHER)
Admission: RE | Disposition: A | Discharge status: Home / Self Care | Payer: Self-pay | Source: Home / Self Care | Attending: Podiatry

## 2019-05-08 ENCOUNTER — Other Ambulatory Visit: Payer: Self-pay

## 2019-05-08 ENCOUNTER — Encounter: Payer: Self-pay | Admitting: Podiatry

## 2019-05-08 ENCOUNTER — Ambulatory Visit (HOSPITAL_BASED_OUTPATIENT_CLINIC_OR_DEPARTMENT_OTHER)
Admission: RE | Admit: 2019-05-08 | Discharge: 2019-05-08 | Disposition: A | Discharge status: Home / Self Care | Attending: Podiatry | Admitting: Podiatry

## 2019-05-08 ENCOUNTER — Ambulatory Visit (HOSPITAL_BASED_OUTPATIENT_CLINIC_OR_DEPARTMENT_OTHER): Admitting: Anesthesiology

## 2019-05-08 DIAGNOSIS — F419 Anxiety disorder, unspecified: Secondary | ICD-10-CM | POA: Insufficient documentation

## 2019-05-08 DIAGNOSIS — L905 Scar conditions and fibrosis of skin: Secondary | ICD-10-CM | POA: Diagnosis not present

## 2019-05-08 DIAGNOSIS — E039 Hypothyroidism, unspecified: Secondary | ICD-10-CM | POA: Diagnosis not present

## 2019-05-08 DIAGNOSIS — M216X1 Other acquired deformities of right foot: Secondary | ICD-10-CM | POA: Diagnosis not present

## 2019-05-08 DIAGNOSIS — Z419 Encounter for procedure for purposes other than remedying health state, unspecified: Secondary | ICD-10-CM

## 2019-05-08 DIAGNOSIS — M2141 Flat foot [pes planus] (acquired), right foot: Secondary | ICD-10-CM | POA: Diagnosis not present

## 2019-05-08 DIAGNOSIS — M722 Plantar fascial fibromatosis: Secondary | ICD-10-CM | POA: Diagnosis not present

## 2019-05-08 HISTORY — PX: CALCANEAL OSTEOTOMY: SHX1281

## 2019-05-08 HISTORY — DX: Polycystic ovarian syndrome: E28.2

## 2019-05-08 HISTORY — DX: Hyperlipidemia, unspecified: E78.5

## 2019-05-08 HISTORY — PX: STERIOD INJECTION: SHX5046

## 2019-05-08 HISTORY — DX: Vitamin D deficiency, unspecified: E55.9

## 2019-05-08 HISTORY — PX: FLAT FOOT RECONSTRUCTION-TAL GASTROC RECESSION: SHX6620

## 2019-05-08 LAB — POCT PREGNANCY, URINE: Preg Test, Ur: NEGATIVE

## 2019-05-08 SURGERY — STEROID INJECTION
Anesthesia: General | Site: Foot | Laterality: Right

## 2019-05-08 MED ORDER — PROPOFOL 10 MG/ML IV BOLUS
INTRAVENOUS | Status: DC | PRN
  Administered 2019-05-08: 200 mg via INTRAVENOUS
  Administered 2019-05-08 (×2): 100 mg via INTRAVENOUS

## 2019-05-08 MED ORDER — HYDROMORPHONE HCL 1 MG/ML IJ SOLN
INTRAMUSCULAR | Status: AC
  Filled 2019-05-08: qty 1

## 2019-05-08 MED ORDER — ROCURONIUM BROMIDE 100 MG/10ML IV SOLN
INTRAVENOUS | Status: DC | PRN
  Administered 2019-05-08: 20 mg via INTRAVENOUS
  Administered 2019-05-08: 30 mg via INTRAVENOUS

## 2019-05-08 MED ORDER — CEFAZOLIN SODIUM-DEXTROSE 2-4 GM/100ML-% IV SOLN
INTRAVENOUS | Status: AC
  Filled 2019-05-08: qty 100

## 2019-05-08 MED ORDER — ONDANSETRON HCL 4 MG/2ML IJ SOLN
INTRAMUSCULAR | Status: AC
  Filled 2019-05-08: qty 2

## 2019-05-08 MED ORDER — LIDOCAINE 2% (20 MG/ML) 5 ML SYRINGE
INTRAMUSCULAR | Status: DC | PRN
  Administered 2019-05-08: 50 mg via INTRAVENOUS

## 2019-05-08 MED ORDER — FENTANYL CITRATE (PF) 100 MCG/2ML IJ SOLN
INTRAMUSCULAR | Status: DC | PRN
  Administered 2019-05-08: 25 ug via INTRAVENOUS
  Administered 2019-05-08 (×3): 50 ug via INTRAVENOUS
  Administered 2019-05-08 (×2): 25 ug via INTRAVENOUS
  Administered 2019-05-08: 50 ug via INTRAVENOUS
  Administered 2019-05-08 (×5): 25 ug via INTRAVENOUS

## 2019-05-08 MED ORDER — CEPHALEXIN 500 MG PO CAPS: 500.0000 mg | ORAL_CAPSULE | Freq: Three times a day (TID) | ORAL | 0 refills | Status: DC

## 2019-05-08 MED ORDER — WHITE PETROLATUM EX OINT
TOPICAL_OINTMENT | CUTANEOUS | Status: AC
  Filled 2019-05-08: qty 5

## 2019-05-08 MED ORDER — BUPIVACAINE-EPINEPHRINE (PF) 0.5% -1:200000 IJ SOLN
INTRAMUSCULAR | Status: DC | PRN
  Administered 2019-05-08: 30 mL via PERINEURAL

## 2019-05-08 MED ORDER — MIDAZOLAM HCL 2 MG/2ML IJ SOLN
INTRAMUSCULAR | Status: AC
  Filled 2019-05-08: qty 2

## 2019-05-08 MED ORDER — GLYCOPYRROLATE PF 0.2 MG/ML IJ SOSY
PREFILLED_SYRINGE | INTRAMUSCULAR | Status: AC
  Filled 2019-05-08: qty 1

## 2019-05-08 MED ORDER — LIDOCAINE HCL 2 % IJ SOLN
INTRAMUSCULAR | Status: DC | PRN
  Administered 2019-05-08: 1 mL

## 2019-05-08 MED ORDER — MIDAZOLAM HCL 5 MG/5ML IJ SOLN
INTRAMUSCULAR | Status: DC | PRN
  Administered 2019-05-08 (×2): 1 mg via INTRAVENOUS

## 2019-05-08 MED ORDER — SUCCINYLCHOLINE CHLORIDE 200 MG/10ML IV SOSY
PREFILLED_SYRINGE | INTRAVENOUS | Status: DC | PRN
  Administered 2019-05-08: 140 mg via INTRAVENOUS

## 2019-05-08 MED ORDER — LIDOCAINE HCL 2 % IJ SOLN
INTRAMUSCULAR | Status: AC
  Filled 2019-05-08: qty 40

## 2019-05-08 MED ORDER — DEXAMETHASONE SODIUM PHOSPHATE 10 MG/ML IJ SOLN
INTRAMUSCULAR | Status: AC
  Filled 2019-05-08: qty 1

## 2019-05-08 MED ORDER — LACTATED RINGERS IV SOLN
INTRAVENOUS | Status: DC
  Administered 2019-05-08 (×2): via INTRAVENOUS
  Filled 2019-05-08: qty 1000

## 2019-05-08 MED ORDER — FENTANYL CITRATE (PF) 100 MCG/2ML IJ SOLN
INTRAMUSCULAR | Status: AC
  Filled 2019-05-08: qty 2

## 2019-05-08 MED ORDER — PROMETHAZINE HCL 25 MG PO TABS: 25.0000 mg | ORAL_TABLET | Freq: Three times a day (TID) | ORAL | 0 refills | Status: DC | PRN

## 2019-05-08 MED ORDER — CEFAZOLIN SODIUM-DEXTROSE 2-4 GM/100ML-% IV SOLN
2.0000 g | INTRAVENOUS | Status: AC
  Administered 2019-05-08: 2 g via INTRAVENOUS
  Filled 2019-05-08: qty 100

## 2019-05-08 MED ORDER — METHYLPREDNISOLONE ACETATE 40 MG/ML IJ SUSP
INTRAMUSCULAR | Status: AC
  Filled 2019-05-08: qty 2

## 2019-05-08 MED ORDER — GLYCOPYRROLATE 0.2 MG/ML IJ SOLN
INTRAMUSCULAR | Status: DC | PRN
  Administered 2019-05-08: 0.1 mg via INTRAVENOUS

## 2019-05-08 MED ORDER — DEXAMETHASONE SODIUM PHOSPHATE 4 MG/ML IJ SOLN
INTRAMUSCULAR | Status: DC | PRN
  Administered 2019-05-08: 10 mg via INTRAVENOUS

## 2019-05-08 MED ORDER — MEPERIDINE HCL 25 MG/ML IJ SOLN
6.2500 mg | INTRAMUSCULAR | Status: DC | PRN
  Filled 2019-05-08: qty 1

## 2019-05-08 MED ORDER — SUGAMMADEX SODIUM 200 MG/2ML IV SOLN
INTRAVENOUS | Status: DC | PRN
  Administered 2019-05-08: 200 mg via INTRAVENOUS

## 2019-05-08 MED ORDER — TRIAMCINOLONE ACETONIDE 40 MG/ML IJ SUSP
INTRAMUSCULAR | Status: AC
  Filled 2019-05-08: qty 1

## 2019-05-08 MED ORDER — FENTANYL CITRATE (PF) 100 MCG/2ML IJ SOLN
100.0000 ug | Freq: Once | INTRAMUSCULAR | Status: AC
  Administered 2019-05-08: 12:00:00 100 ug via INTRAVENOUS
  Filled 2019-05-08: qty 2

## 2019-05-08 MED ORDER — LIDOCAINE-EPINEPHRINE 2 %-1:100000 IJ SOLN
INTRAMUSCULAR | Status: DC | PRN
  Administered 2019-05-08: 20 mL via PERINEURAL

## 2019-05-08 MED ORDER — CHLORHEXIDINE GLUCONATE CLOTH 2 % EX PADS
6.0000 | MEDICATED_PAD | Freq: Once | CUTANEOUS | Status: DC
  Filled 2019-05-08: qty 6

## 2019-05-08 MED ORDER — ROCURONIUM BROMIDE 10 MG/ML (PF) SYRINGE
PREFILLED_SYRINGE | INTRAVENOUS | Status: AC
  Filled 2019-05-08: qty 10

## 2019-05-08 MED ORDER — LIDOCAINE 2% (20 MG/ML) 5 ML SYRINGE
INTRAMUSCULAR | Status: AC
  Filled 2019-05-08: qty 5

## 2019-05-08 MED ORDER — PROPOFOL 10 MG/ML IV BOLUS
INTRAVENOUS | Status: AC
  Filled 2019-05-08: qty 20

## 2019-05-08 MED ORDER — ONDANSETRON HCL 4 MG/2ML IJ SOLN
INTRAMUSCULAR | Status: DC | PRN
  Administered 2019-05-08: 4 mg via INTRAVENOUS

## 2019-05-08 MED ORDER — MIDAZOLAM HCL 2 MG/2ML IJ SOLN
2.0000 mg | Freq: Once | INTRAMUSCULAR | Status: AC
  Administered 2019-05-08: 12:00:00 2 mg via INTRAVENOUS
  Filled 2019-05-08: qty 2

## 2019-05-08 MED ORDER — TRIAMCINOLONE ACETONIDE 40 MG/ML IJ SUSP
INTRAMUSCULAR | Status: DC | PRN
  Administered 2019-05-08: 20 mg via INTRAMUSCULAR

## 2019-05-08 MED ORDER — HYDROMORPHONE HCL 1 MG/ML IJ SOLN
0.2500 mg | INTRAMUSCULAR | Status: DC | PRN
  Administered 2019-05-08 (×3): 0.5 mg via INTRAVENOUS
  Filled 2019-05-08: qty 0.5

## 2019-05-08 MED ORDER — SODIUM CHLORIDE 0.9 % IV SOLN
INTRAVENOUS | Status: DC | PRN
  Administered 2019-05-08: 500 mL

## 2019-05-08 MED ORDER — ONDANSETRON HCL 4 MG/2ML IJ SOLN
4.0000 mg | Freq: Once | INTRAMUSCULAR | Status: DC | PRN
  Filled 2019-05-08: qty 2

## 2019-05-08 MED ORDER — SUCCINYLCHOLINE CHLORIDE 200 MG/10ML IV SOSY
PREFILLED_SYRINGE | INTRAVENOUS | Status: AC
  Filled 2019-05-08: qty 10

## 2019-05-08 MED ORDER — OXYCODONE-ACETAMINOPHEN 5-325 MG PO TABS: 1.0000 | ORAL_TABLET | Freq: Four times a day (QID) | ORAL | 0 refills | Status: DC | PRN

## 2019-05-08 MED ORDER — BUPIVACAINE HCL (PF) 0.5 % IJ SOLN
INTRAMUSCULAR | Status: AC
  Filled 2019-05-08: qty 30

## 2019-05-08 SURGICAL SUPPLY — 73 items
ALLOGRAFT COTTON WEDGE 5 (Orthopedic Graft) ×2 IMPLANT
ALLOGRAFT EVANS WEDGE 6 (Orthopedic Graft) ×2 IMPLANT
BAG DECANTER FOR FLEXI CONT (MISCELLANEOUS) ×2 IMPLANT
BANDAGE ACE 3X5.8 VEL STRL LF (GAUZE/BANDAGES/DRESSINGS) ×4 IMPLANT
BIT DRILL LONG ACUTRAK2 7.5 (BIT) IMPLANT
BLADE AVERAGE 25MMX9MM (BLADE) ×2
BLADE AVERAGE 25X9 (BLADE) ×4 IMPLANT
BLADE SURG 15 STRL LF DISP TIS (BLADE) ×4 IMPLANT
BLADE SURG 15 STRL SS (BLADE) ×6
BNDG CONFORM 2 STRL LF (GAUZE/BANDAGES/DRESSINGS) ×4 IMPLANT
BNDG ELASTIC 4X5.8 VLCR STR LF (GAUZE/BANDAGES/DRESSINGS) ×2 IMPLANT
BNDG ELASTIC 6X5.8 VLCR STR LF (GAUZE/BANDAGES/DRESSINGS) ×2 IMPLANT
BNDG ESMARK 4X9 LF (GAUZE/BANDAGES/DRESSINGS) ×4 IMPLANT
BNDG GAUZE ELAST 4 BULKY (GAUZE/BANDAGES/DRESSINGS) ×4 IMPLANT
COVER TABLE BACK 60X90 (DRAPES) ×4 IMPLANT
COVER WAND RF STERILE (DRAPES) IMPLANT
CUFF TOURN SGL QUICK 18X4 (TOURNIQUET CUFF) IMPLANT
CUFF TOURN SGL QUICK 24 (TOURNIQUET CUFF)
CUFF TOURN SGL QUICK 34 (TOURNIQUET CUFF) ×2
CUFF TRNQT CYL 24X4X16.5-23 (TOURNIQUET CUFF) IMPLANT
CUFF TRNQT CYL 34X4.125X (TOURNIQUET CUFF) IMPLANT
DRAPE C-ARM 42X120 X-RAY (DRAPES) ×2 IMPLANT
DRAPE C-ARMOR (DRAPES) ×2 IMPLANT
DRAPE EXTREMITY T 121X128X90 (DISPOSABLE) ×4 IMPLANT
DRAPE IMP U-DRAPE 54X76 (DRAPES) ×4 IMPLANT
DRAPE OEC MINIVIEW 54X84 (DRAPES) IMPLANT
DRAPE U-SHAPE 47X51 STRL (DRAPES) ×2 IMPLANT
DRILL LONG ACUTRAK2 7.5 (BIT) ×4
DRSG ADAPTIC 3X8 NADH LF (GAUZE/BANDAGES/DRESSINGS) ×2 IMPLANT
DRSG EMULSION OIL 3X3 NADH (GAUZE/BANDAGES/DRESSINGS) ×4 IMPLANT
DURAPREP 26ML APPLICATOR (WOUND CARE) ×4 IMPLANT
ELECT REM PT RETURN 9FT ADLT (ELECTROSURGICAL) ×4
ELECTRODE REM PT RTRN 9FT ADLT (ELECTROSURGICAL) ×2 IMPLANT
GAUZE 4X4 16PLY RFD (DISPOSABLE) IMPLANT
GAUZE SPONGE 4X4 12PLY STRL (GAUZE/BANDAGES/DRESSINGS) ×4 IMPLANT
GAUZE SPONGE 4X4 12PLY STRL LF (GAUZE/BANDAGES/DRESSINGS) ×2 IMPLANT
GAUZE SPONGE 4X4 16PLY XRAY LF (GAUZE/BANDAGES/DRESSINGS) ×2 IMPLANT
GLOVE BIO SURGEON STRL SZ7.5 (GLOVE) ×8 IMPLANT
GOWN STRL REUS W/ TWL LRG LVL3 (GOWN DISPOSABLE) ×2 IMPLANT
GOWN STRL REUS W/ TWL XL LVL3 (GOWN DISPOSABLE) ×2 IMPLANT
GOWN STRL REUS W/TWL LRG LVL3 (GOWN DISPOSABLE) ×2
GOWN STRL REUS W/TWL XL LVL3 (GOWN DISPOSABLE) ×2
GUIDEWIRE ORTHO .094INX9.25IN (WIRE) ×2 IMPLANT
NDL HYPO 25X1 1.5 SAFETY (NEEDLE) ×2 IMPLANT
NDL SAFETY ECLIPSE 18X1.5 (NEEDLE) IMPLANT
NEEDLE HYPO 18GX1.5 SHARP (NEEDLE)
NEEDLE HYPO 25X1 1.5 SAFETY (NEEDLE) ×4 IMPLANT
NS IRRIG 1000ML POUR BTL (IV SOLUTION) ×2 IMPLANT
PACK BASIN DAY SURGERY FS (CUSTOM PROCEDURE TRAY) ×4 IMPLANT
PADDING CAST ABS 4INX4YD NS (CAST SUPPLIES) ×2
PADDING CAST ABS COTTON 4X4 ST (CAST SUPPLIES) ×2 IMPLANT
PENCIL BUTTON HOLSTER BLD 10FT (ELECTRODE) ×4 IMPLANT
SCREW CANN FT 7.5X50 (Screw) ×2 IMPLANT
SLEEVE SURGEON STRL (DRAPES) ×2 IMPLANT
STAPLER VISISTAT 35W (STAPLE) ×4 IMPLANT
STOCKINETTE 6  STRL (DRAPES) ×2
STOCKINETTE 6 STRL (DRAPES) ×2 IMPLANT
STRIP SUTURE WOUND CLOSURE 1/2 (SUTURE) IMPLANT
SUCTION FRAZIER TIP 10 FR DISP (SUCTIONS) ×4 IMPLANT
SUT MERSILENE 2.0 SH NDLE (SUTURE) ×4 IMPLANT
SUT MERSILENE 3 0 FS 1 (SUTURE) ×6 IMPLANT
SUT MNCRL AB 3-0 PS2 18 (SUTURE) ×6 IMPLANT
SUT MNCRL AB 4-0 PS2 18 (SUTURE) IMPLANT
SUT MON AB 2-0 SH 27 (SUTURE) ×2
SUT MON AB 2-0 SH27 (SUTURE) IMPLANT
SUT MON AB 3-0 SH 27 (SUTURE) ×2
SUT MON AB 3-0 SH27 (SUTURE) IMPLANT
SUT MON AB 4-0 PS1 27 (SUTURE) ×2 IMPLANT
SUT MON AB 5-0 PS2 18 (SUTURE) ×4 IMPLANT
SUT PROLENE 3 0 PS 2 (SUTURE) ×8 IMPLANT
SYR 10ML LL (SYRINGE) IMPLANT
SYR BULB 3OZ (MISCELLANEOUS) ×4 IMPLANT
UNDERPAD 30X30 (UNDERPADS AND DIAPERS) ×4 IMPLANT

## 2019-05-08 NOTE — Anesthesia Postprocedure Evaluation (Signed)
Anesthesia Post Note  Patient: Wendy Watkins  Procedure(s) Performed: STEROID INJECTION (Left Foot) FLAT FOOT RECONSTRUCTION-TAL GASTROC RECESSION (Right Foot) EVAN CALCANEAL OSTEOTOMY (Right Foot)     Patient location during evaluation: PACU Anesthesia Type: General Level of consciousness: awake and alert Pain management: pain level controlled Vital Signs Assessment: post-procedure vital signs reviewed and stable Respiratory status: spontaneous breathing, nonlabored ventilation, respiratory function stable and patient connected to nasal cannula oxygen Cardiovascular status: blood pressure returned to baseline and stable Postop Assessment: no apparent nausea or vomiting Anesthetic complications: no    Last Vitals:  Vitals:   05/08/19 1845 05/08/19 1848  BP: 133/69   Pulse: 92 94  Resp: 18 18  Temp: 36.7 C   SpO2: 92% 92%    Last Pain:  Vitals:   05/08/19 1900  TempSrc:   PainSc: 4    Pain Goal: Patients Stated Pain Goal: 5 (05/08/19 1900)                 Brianah Hopson L Kean Gautreau

## 2019-05-08 NOTE — Anesthesia Procedure Notes (Signed)
Procedure Name: LMA Insertion Date/Time: 05/08/2019 1:22 PM Performed by: Suan Halter, CRNA Pre-anesthesia Checklist: Patient identified, Emergency Drugs available, Suction available and Patient being monitored Patient Re-evaluated:Patient Re-evaluated prior to induction Oxygen Delivery Method: Circle system utilized Preoxygenation: Pre-oxygenation with 100% oxygen Induction Type: IV induction Ventilation: Mask ventilation without difficulty LMA: LMA inserted LMA Size: 4.0 Number of attempts: 1 Airway Equipment and Method: Bite block Placement Confirmation: positive ETCO2 Tube secured with: Tape Dental Injury: Teeth and Oropharynx as per pre-operative assessment

## 2019-05-08 NOTE — H&P (Signed)
Patient presents today to [facility] surgical center for surgical correction of right flatfoot. We again discussed the surgery and postop course and also all alternatives, risks, complications. No promises or guarantees given. Block preformed by anesthesia. Surgical consent signed. NPO. Will plan as scheduled and she had no further questions or concerns.

## 2019-05-08 NOTE — Progress Notes (Signed)
Left foot has 2 injection sites where she had steroids.  Band aids over both sites clean dry and intact.

## 2019-05-08 NOTE — Progress Notes (Signed)
Assisted Dr. Ossey with right, ultrasound guided, popliteal/saphenous block. Side rails up, monitors on throughout procedure. See vital signs in flow sheet. Tolerated Procedure well. 

## 2019-05-08 NOTE — Discharge Instructions (Signed)
Resume all home medications Stay off of your foot Ice/elevate Wear surgical boot at all times   Post Anesthesia Home Care Instructions  Activity: Get plenty of rest for the remainder of the day. A responsible individual must stay with you for 24 hours following the procedure.  For the next 24 hours, DO NOT: -Drive a car -Paediatric nurse -Drink alcoholic beverages -Take any medication unless instructed by your physician -Make any legal decisions or sign important papers.  Meals: Start with liquid foods such as gelatin or soup. Progress to regular foods as tolerated. Avoid greasy, spicy, heavy foods. If nausea and/or vomiting occur, drink only clear liquids until the nausea and/or vomiting subsides. Call your physician if vomiting continues.  Special Instructions/Symptoms: Your throat may feel dry or sore from the anesthesia or the breathing tube placed in your throat during surgery. If this causes discomfort, gargle with warm salt water. The discomfort should disappear within 24 hours.  If you had a scopolamine patch placed behind your ear for the management of post- operative nausea and/or vomiting:

## 2019-05-08 NOTE — Brief Op Note (Signed)
05/08/2019  5:08 PM  PATIENT:  Wendy Watkins  43 y.o. female  PRE-OPERATIVE DIAGNOSIS:  RIGHT FLAT FOOT, AND LEFT SCAR, GASTROC EQUINUS DEFORMITY  POST-OPERATIVE DIAGNOSIS:  RIGHT FLAT FOOT, AND LEFT SCAR, GASTROC EQUINUS DEFORMITY  PROCEDURE:  Procedure(s): STEROID INJECTION (Left) FLAT FOOT RECONSTRUCTION-TAL GASTROC RECESSION (Right) EVAN CALCANEAL OSTEOTOMY (Right)  SURGEON:  Surgeon(s) and Role:    * Trula Slade, DPM - Primary    * Evelina Bucy, DPM - Assisting  PHYSICIAN ASSISTANT:   ASSISTANTS: none   ANESTHESIA:   general  EBL:  25 mL   BLOOD ADMINISTERED:none  DRAINS: none   LOCAL MEDICATIONS USED:  OTHER 0.5 cc kenalog and 1cc 2% lidocaine plain on the LEFT foot  SPECIMEN:  No Specimen  DISPOSITION OF SPECIMEN:  N/A  COUNTS:  YES  TOURNIQUET:   Total Tourniquet Time Documented: Thigh (Right) - 120 minutes Total: Thigh (Right) - 120 minutes   DICTATION: .Dragon Dictation  PLAN OF CARE: Discharge to home after PACU  PATIENT DISPOSITION:  PACU - hemodynamically stable.   Delay start of Pharmacological VTE agent (>24hrs) due to surgical blood loss or risk of bleeding: no

## 2019-05-08 NOTE — Anesthesia Procedure Notes (Signed)
Anesthesia Regional Block: Popliteal block   Pre-Anesthetic Checklist: ,, timeout performed, Correct Patient, Correct Site, Correct Laterality, Correct Procedure, Correct Position, site marked, Risks and benefits discussed,  Surgical consent,  Pre-op evaluation,  At surgeon's request and post-op pain management  Laterality: Right  Prep: chloraprep       Needles:  Injection technique: Single-shot  Needle Type: Echogenic Stimulator Needle     Needle Length: 10cm  Needle Gauge: 21     Additional Needles:   Procedures:, nerve stimulator,,,,,,,   Nerve Stimulator or Paresthesia:  Response: 0.4 mA,   Additional Responses:   Narrative:  Start time: 05/08/2019 12:45 PM End time: 05/08/2019 1:05 PM Injection made incrementally with aspirations every 5 mL.  Performed by: Personally  Anesthesiologist: Lillia Abed, MD  Additional Notes: Monitors applied. Patient sedated. Sterile prep and drape,hand hygiene and sterile gloves were used. Relevant anatomy identified.Needle position confirmed.Local anesthetic injected incrementally after negative aspiration. Local anesthetic spread visualized around nerve(s). Vascular puncture avoided. No complications. Image printed for medical record.The patient tolerated the procedure well.  Additional Saphenous nerve block performed. 15cc Local Anesthetic mixture placed under ultrasonic guidance along the medio-inferior border of the Sartorious muscle 6 inches above the knee.  No Problems encountered.  Lillia Abed MD

## 2019-05-08 NOTE — Anesthesia Procedure Notes (Signed)
Procedure Name: Intubation Date/Time: 05/08/2019 1:56 PM Performed by: Suan Halter, CRNA Pre-anesthesia Checklist: Patient identified, Emergency Drugs available, Suction available and Patient being monitored Patient Re-evaluated:Patient Re-evaluated prior to induction Oxygen Delivery Method: Circle system utilized Preoxygenation: Pre-oxygenation with 100% oxygen Induction Type: IV induction Ventilation: Mask ventilation without difficulty Laryngoscope Size: Mac and 3 Grade View: Grade II Tube type: Oral Tube size: 7.0 mm Number of attempts: 1 Airway Equipment and Method: Stylet and Oral airway Placement Confirmation: ETT inserted through vocal cords under direct vision,  positive ETCO2 and breath sounds checked- equal and bilateral Secured at: 23 cm Tube secured with: Tape Dental Injury: Teeth and Oropharynx as per pre-operative assessment

## 2019-05-08 NOTE — Transfer of Care (Signed)
Immediate Anesthesia Transfer of Care Note  Patient: ALEAN KROMER  Procedure(s) Performed: Procedure(s) (LRB): STEROID INJECTION (Left) FLAT FOOT RECONSTRUCTION-TAL GASTROC RECESSION (Right) EVAN CALCANEAL OSTEOTOMY (Right)  Patient Location: PACU  Anesthesia Type: General  Level of Consciousness: awake, sedated, patient cooperative and responds to stimulation  Airway & Oxygen Therapy: Patient Spontanous Breathing and Patient connected to [place] 02 and soft face mask   Post-op Assessment: Report given to PACU RN, Post -op Vital signs reviewed and stable and Patient moving all extremities  Post vital signs: Reviewed and stable  Complications: No apparent anesthesia complications

## 2019-05-08 NOTE — Anesthesia Preprocedure Evaluation (Signed)
Anesthesia Evaluation  Patient identified by MRN, date of birth, ID band Patient awake    Reviewed: Allergy & Precautions, NPO status , Patient's Chart, lab work & pertinent test results  Airway Mallampati: I  TM Distance: >3 FB Neck ROM: Full    Dental   Pulmonary    Pulmonary exam normal        Cardiovascular Normal cardiovascular exam     Neuro/Psych Anxiety    GI/Hepatic GERD  Medicated and Controlled,  Endo/Other    Renal/GU      Musculoskeletal   Abdominal   Peds  Hematology   Anesthesia Other Findings   Reproductive/Obstetrics                             Anesthesia Physical Anesthesia Plan  ASA: II  Anesthesia Plan: General   Post-op Pain Management:    Induction: Intravenous  PONV Risk Score and Plan: 3 and Ondansetron, Midazolam and Treatment may vary due to age or medical condition  Airway Management Planned: LMA  Additional Equipment:   Intra-op Plan:   Post-operative Plan: Extubation in OR  Informed Consent: I have reviewed the patients History and Physical, chart, labs and discussed the procedure including the risks, benefits and alternatives for the proposed anesthesia with the patient or authorized representative who has indicated his/her understanding and acceptance.       Plan Discussed with: CRNA and Surgeon  Anesthesia Plan Comments:         Anesthesia Quick Evaluation

## 2019-05-09 ENCOUNTER — Telehealth: Payer: Self-pay | Admitting: Podiatry

## 2019-05-09 ENCOUNTER — Telehealth: Payer: Self-pay | Admitting: *Deleted

## 2019-05-09 ENCOUNTER — Other Ambulatory Visit: Payer: Self-pay | Admitting: Podiatry

## 2019-05-09 ENCOUNTER — Encounter: Payer: Self-pay | Admitting: Podiatry

## 2019-05-09 MED ORDER — OXYCODONE-ACETAMINOPHEN 10-325 MG PO TABS: 1.0000 | ORAL_TABLET | ORAL | 0 refills | Status: DC | PRN

## 2019-05-09 MED ORDER — IBUPROFEN 800 MG PO TABS: 800.0000 mg | ORAL_TABLET | Freq: Three times a day (TID) | ORAL | 0 refills | Status: DC | PRN

## 2019-05-09 NOTE — Telephone Encounter (Signed)
Called and spoke with the patient and the patient stated that she called the on call doctor last night and has a new prescription that her husband has gone to get and has not taken it yet but will be taken as soon as husband gets home with it and there was some nausea last night but none today and pain today is about a 5 on pain scale and states that it is a little rough today and has been icing and elevating and I stated to call the  office if any concerns or questions at [phone]. Wendy Watkins

## 2019-05-09 NOTE — Telephone Encounter (Signed)
Call the patient to see how she was doing this afternoon.  She said that she is feeling better and she is tolerating the increase Percocet dose well.  She has not been taking ibuprofen.  She states that she is sleepy but otherwise feeling well.  She denies any other issues.  She has no fevers or chills.  She can move her toes without pain she has good color to her toes.  I encouraged her to call the office with any questions or concerns or any changes.  Trula Slade

## 2019-05-09 NOTE — Progress Notes (Signed)
Prescribed percocet 10/325 every 4 hours and between can take ibuprofen. I called and spoke to her. I had told her husband that I didn't think the nerve block was good during the surgery and she felt it was different this time as well. Encouraged ice/elevation. Loosen ACE wrap.

## 2019-05-10 ENCOUNTER — Other Ambulatory Visit (HOSPITAL_COMMUNITY): Payer: Self-pay | Admitting: Otolaryngology

## 2019-05-10 ENCOUNTER — Other Ambulatory Visit: Payer: Self-pay | Admitting: Otolaryngology

## 2019-05-10 ENCOUNTER — Encounter (HOSPITAL_BASED_OUTPATIENT_CLINIC_OR_DEPARTMENT_OTHER): Payer: Self-pay | Admitting: Podiatry

## 2019-05-10 DIAGNOSIS — H9313 Tinnitus, bilateral: Secondary | ICD-10-CM

## 2019-05-10 NOTE — Op Note (Signed)
PRE-OPERATIVE DIAGNOSIS:  RIGHT FLAT FOOT, AND LEFT SCAR, GASTROC EQUINUS DEFORMITY  POST-OPERATIVE DIAGNOSIS:  RIGHT FLAT FOOT, AND LEFT SCAR, GASTROC EQUINUS DEFORMITY  PROCEDURE:  Procedure(s): STEROID INJECTION (Left) FLAT FOOT RECONSTRUCTION-TAL GASTROC RECESSION (Right) EVAN CALCANEAL OSTEOTOMY (Right)  SURGEON:  Surgeon(s) and Role:    * Vivi BarrackWagoner, Matthew R, DPM - Primary    * Park LiterPrice, Michael J, DPM - Assisting  PHYSICIAN ASSISTANT:   ASSISTANTS: none   ANESTHESIA:   general  EBL:  25 mL   BLOOD ADMINISTERED:none  DRAINS: none   LOCAL MEDICATIONS USED:  OTHER 0.5 cc kenalog and 1cc 2% lidocaine plain on the LEFT foot  SPECIMEN:  No Specimen  DISPOSITION OF SPECIMEN:  N/A  COUNTS:  YES  TOURNIQUET:   Total Tourniquet Time Documented: Thigh (Right) - 120 minutes Total: Thigh (Right) - 120 minutes   DICTATION: .Dragon Dictation  PLAN OF CARE: Discharge to home after PACU  PATIENT DISPOSITION:  PACU - hemodynamically stable.   Delay start of Pharmacological VTE agent (>24hrs) due to surgical blood loss or risk of bleeding: no          Indications for surgery: 43 year old female initially presented to the office with concerns of symptomatic flatfeet.  She underwent a long period of conservative care including, but not limited to shoe modifications, orthotics, physical therapy without any significant improvement.  She previously underwent flatfoot reconstruction on her left foot and she did well and she went again to proceed with surgical intervention of the right foot.  We discussed the surgery as well as the postoperative course.  Discussed alternatives, risks, complications.  No promises or guarantees were given at the outcome of the procedure and all questions were answered to the best of my ability.  The procedure in detail: Patient was verbally and visually identified by myself and nursing staff and the anesthesia staff preoperatively.   She had a leg block performed by the anesthesia staff.  She was transferred the operating room via stretcher and placed on the operating table in supine position.  A well-padded thigh tourniquet was placed on the right lower extremity.  After general anesthesia was obtained a timeout was performed.  Attention was first ray to the left foot on the area of the scars for steroid injection.  Mixture of 0.5 cc of Kenalog 10, 1 cc of 2% lidocaine plain was infiltrated subdermally underneath the scars to the lateral aspect of the heel as well as from the medial cuneiform.  She tolerated well.  The right lower extremity was then scrubbed prepped and draped in normal sterile fashion.  A secondary timeout was performed.  The right lower extremity was exsanguinated and Esmarch bandage the pneumatic thigh tourniquet was applied to 350 mmHg.  Attention was first directed to the posterior aspect of the leg on the gastrocnemius aponeurosis.  Longitudinal incision approximately 3 cm in length was made with a 15 blade scalpel the epidermis and the dermis.  The subcutaneous tissues were then bluntly and sharply dissected making sure to retract all vital neurovascular structures and all bleeders were cauterized and ligated as necessary.  Dissection was then carried out in a layered fashion.  The fascia was then incised and the aponeurosis was identified and a transverse incision was made.  At this time there is found to be adequate dorsiflexion of the ankle.  Thompson test was negative and the tendons appear to be intact.  Incision was copiously irrigated with saline and hemostasis was achieved.  The deep  structures was closed with 3-0 Monocryl followed by the skin with 4-0 Monocryl and the skin was then closed with Prolene, skin staples.  Attention was then directed to the lateral aspect of heel for the medial calcaneal slide osteotomy.  An oblique incision was made after confirmation was performed with fluoroscopy.  An  oblique incision was made with 15 blade scalpel the epidermis and the dermis.  The subcutaneous tissues were then bluntly and sharply dissected making sure to retract all vital neurovascular structures.  A deep incision was made on the calcaneus in line with the osteotomy.  This was confirmed under fluoroscopy.  Once this was confirmed I utilized a sagittal bone saw to make the osteotomy in the medial cortex was finished with a osteotome to avoid any neurovascular or tendinous injury to the medial ankle.  The fragment was then shifted medially into a corrected position.  A guidewire for Integra screw was then inserted across the osteotomy confirmed under fluoroscopy.  Once the appropriate position screw was inserted over the guidewire to adequate compression.  Fluoroscopy was utilized to confirm that the hardware is in adequate position.  At this time the heel appeared to be rectus.    Attention was then directed for the Evans calcaneal osteotomy.  An incision was made inferior to the sinus tarsi in a horizontal fashion.  The incision was made with a 15 with scalpel the epidermis and the dermis.  The subcutaneous tissues were bluntly and sharply dissected making sure to retract all vital neurovascular structures and all bleeders were cauterized as necessary.  At this time the peroneal tendons were identified and were reflected.  Fluoroscopy was utilized to confirm the osteotomy site.  Once the appropriate position a deep incision was then made.  Next a sagittal bone saw was utilized in order to make the osteotomy in the medial aspect was finished with an osteotome make sure to keep the medial cortex intact.  After trial sizers were obtained I utilized size 6 Evans calcaneal graft.  This was then tamped into place.  Fluoroscopy utilized to confirm adequate reduction of the deformity.  The graft did appear to be sitting inferior however upon clinical evaluation it did not seem to be protruding significantly.  I did  a rongeur small portion of the inferior part but did not seem to be prominent for the majority of the inferior calcaneus.  At this time the incision was inspected and was found to the peroneal tendon had been partially lacerated to the peroneus brevis.  This was repaired utilizing Mersilene suture.  At this time both incisions of the lateral heel with an irrigated with saline and were closed.  The deep structures were closed with 2-0 Monocryl followed by the subcutaneous tissues with 3-0 and 4-0 Monocryl the skin was then closed with Prolene as well as skin staples.  Attention was directed on the dorsal aspect of the foot on the medial cuneiform.  Fluoroscopy was utilized to confirm the position of the osteotomy.  An vertical incision was made with a 15 blade scalpel the epidermis and dermis.  The subcutaneous tissues were bluntly and sharply dissected making sure retract all vital neurovascular structures and all bleeders were cauterized as necessary.  The tibialis anterior tendon was identified and was retracted.  Next under fluoroscopy guidance I can pleaded an osteotomy on the dorsal aspect of the medial cuneiform with a cotton osteotomy.  Trial sizers were performed and the size 5 cotton graft was then inserted.  Fluoroscopy  was utilized to confirm adequate reduction of the deformity.  Incision was irrigated with saline.  The deep structures were closed with 3-0 Monocryl in the subcutaneous tissues as well.  The skin was then closed with Prolene as well skin staples.  During the procedure once the tourniquet reached 2 hours it was deflated hemostasis was achieved.  There was an immediate capillary fill time to all the digits.  At this time final fluoroscopy was confirmed that the reduction of the deformity had been completed and hardware intact.  The incisions were dressed with a nonstick dressing followed by 4 x 4's and a dry sterile dressing.  At this time she was awoken from anesthesia and found to  tolerate the procedure well there are any complications and she was transferred to PACU vital signs stable and vascular status intact.  Postoperative instructions were discussed with her as well as her husband.  I discussed intraoperative findings with the patient's husband including the lacerated peroneal tendon.  This was repaired I do think she will do well.

## 2019-05-14 ENCOUNTER — Other Ambulatory Visit: Payer: Self-pay

## 2019-05-14 ENCOUNTER — Ambulatory Visit (INDEPENDENT_AMBULATORY_CARE_PROVIDER_SITE_OTHER): Payer: Self-pay | Admitting: Podiatry

## 2019-05-14 ENCOUNTER — Encounter: Payer: Self-pay | Admitting: Podiatry

## 2019-05-14 ENCOUNTER — Ambulatory Visit (INDEPENDENT_AMBULATORY_CARE_PROVIDER_SITE_OTHER)

## 2019-05-14 DIAGNOSIS — M2141 Flat foot [pes planus] (acquired), right foot: Secondary | ICD-10-CM

## 2019-05-14 DIAGNOSIS — M216X1 Other acquired deformities of right foot: Secondary | ICD-10-CM | POA: Diagnosis not present

## 2019-05-14 DIAGNOSIS — Z09 Encounter for follow-up examination after completed treatment for conditions other than malignant neoplasm: Secondary | ICD-10-CM

## 2019-05-15 ENCOUNTER — Encounter (INDEPENDENT_AMBULATORY_CARE_PROVIDER_SITE_OTHER): Payer: Self-pay | Admitting: Internal Medicine

## 2019-05-16 ENCOUNTER — Ambulatory Visit (HOSPITAL_COMMUNITY)
Admission: RE | Admit: 2019-05-16 | Discharge: 2019-05-16 | Disposition: A | Discharge status: Home / Self Care | Source: Ambulatory Visit | Attending: Otolaryngology | Admitting: Otolaryngology

## 2019-05-16 ENCOUNTER — Other Ambulatory Visit: Payer: Self-pay

## 2019-05-16 DIAGNOSIS — H9313 Tinnitus, bilateral: Secondary | ICD-10-CM | POA: Insufficient documentation

## 2019-05-16 LAB — POCT HEMOGLOBIN-HEMACUE: Hemoglobin: 12.5 g/dL (ref 12.0–15.0)

## 2019-05-16 MED ORDER — GADOBUTROL 1 MMOL/ML IV SOLN
10.0000 mL | Freq: Once | INTRAVENOUS | Status: AC | PRN
  Administered 2019-05-16: 10 mL via INTRAVENOUS

## 2019-05-17 ENCOUNTER — Ambulatory Visit (HOSPITAL_COMMUNITY): Admission: RE | Admit: 2019-05-17 | Source: Ambulatory Visit

## 2019-05-20 NOTE — Progress Notes (Signed)
Subjective: Wendy Watkins is a 43 y.o. is seen today in office s/p right flatfoot reconstruction and left foot steroid injection on the hypertrophic scar preformed on 05/08/2019.  She said that she is doing much better her pain is much better controlled.  She has been nonweightbearing.  She denies any recent injury or falls.  Denies any systemic complaints such as fevers, chills, nausea, vomiting. No calf pain, chest pain, shortness of breath.   Objective: General: No acute distress, AAOx3  DP/PT pulses palpable 2/4, CRT < 3 sec to all digits.  Protective sensation intact. Motor function intact.  RIGHT foot: Incision is well coapted without any evidence of dehiscence with sutures, staples intact. There is no surrounding erythema, ascending cellulitis, fluctuance, crepitus, malodor, drainage/purulence. There is mild to moderate edema around the surgical site. There is mild pain along the surgical site.  Incisions appear to be healing well. No other areas of tenderness to bilateral lower extremities.  No other open lesions or pre-ulcerative lesions.  No pain with calf compression, swelling, warmth, erythema.   Assessment and Plan:  Status post right flatfoot reconstruction, doing well with no complications   -Treatment options discussed including all alternatives, risks, and complications -X-rays were obtained reviewed.  Hardware intact.  Bone graft intact.  The Evans osteotomy of the graft is sitting plantar.  I discussed this with her.  I do think this will hopefully resorb over time.  Not able to palpate this. -Antibiotic ointment and a dressing applied.  Keep dressing clean, dry, intact. -Due to the swelling offered to the cam boot today.  Likely apply cast next appointment. -Ice/elevation -Pain medication as needed. -NWB -Monitor for any clinical signs or symptoms of infection and DVT/PE and directed to call the office immediately should any occur or go to the ER. -Follow-up as scheduled  or sooner if any problems arise. In the meantime, encouraged to call the office with any questions, concerns, change in symptoms.   [name], DPM

## 2019-05-21 ENCOUNTER — Encounter: Payer: Self-pay | Admitting: Podiatry

## 2019-05-21 ENCOUNTER — Ambulatory Visit (INDEPENDENT_AMBULATORY_CARE_PROVIDER_SITE_OTHER): Admitting: Podiatry

## 2019-05-21 ENCOUNTER — Encounter (INDEPENDENT_AMBULATORY_CARE_PROVIDER_SITE_OTHER): Payer: Self-pay | Admitting: Internal Medicine

## 2019-05-21 ENCOUNTER — Other Ambulatory Visit: Payer: Self-pay

## 2019-05-21 DIAGNOSIS — Z09 Encounter for follow-up examination after completed treatment for conditions other than malignant neoplasm: Secondary | ICD-10-CM

## 2019-05-21 DIAGNOSIS — M216X1 Other acquired deformities of right foot: Secondary | ICD-10-CM | POA: Diagnosis not present

## 2019-05-21 DIAGNOSIS — M2141 Flat foot [pes planus] (acquired), right foot: Secondary | ICD-10-CM | POA: Diagnosis not present

## 2019-05-23 ENCOUNTER — Other Ambulatory Visit: Payer: Self-pay

## 2019-05-23 ENCOUNTER — Other Ambulatory Visit: Payer: Self-pay | Admitting: Otolaryngology

## 2019-05-23 ENCOUNTER — Ambulatory Visit (INDEPENDENT_AMBULATORY_CARE_PROVIDER_SITE_OTHER): Admitting: Otolaryngology

## 2019-05-23 ENCOUNTER — Other Ambulatory Visit (HOSPITAL_COMMUNITY): Payer: Self-pay | Admitting: Otolaryngology

## 2019-05-23 ENCOUNTER — Encounter: Admitting: Podiatry

## 2019-05-23 DIAGNOSIS — H93A9 Pulsatile tinnitus, unspecified ear: Secondary | ICD-10-CM

## 2019-05-26 NOTE — Progress Notes (Signed)
Subjective: Wendy Watkins is a 43 y.o. is seen today in office s/p right flatfoot reconstruction and left foot steroid injection on the hypertrophic scar preformed on 05/08/2019.  She states that she is doing well and her pain is much better controlled.  She has no new concerns and she denies any recent injury or falls. Denies any systemic complaints such as fevers, chills, nausea, vomiting. No calf pain, chest pain, shortness of breath.   Objective: General: No acute distress, AAOx3  DP/PT pulses palpable 2/4, CRT < 3 sec to all digits.  Protective sensation intact. Motor function intact.  RIGHT foot: Incision is well coapted without any evidence of dehiscence with sutures, staples intact. There is no surrounding erythema, ascending cellulitis, fluctuance, crepitus, malodor, drainage/purulence. There is mildly much improved edema around the surgical site. There is minimal pain along the surgical site.  Incisions appear to be healing well without any signs of infection or dehiscence.  Not able to palpate calcaneal graft plantarly. No other areas of tenderness to bilateral lower extremities.  No other open lesions or pre-ulcerative lesions.  No pain with calf compression, swelling, warmth, erythema.   Assessment and Plan:  Status post right flatfoot reconstruction, doing well with no complications   -Treatment options discussed including all alternatives, risks, and complications -Incisions are healing well.  Antibiotic ointment and a dressing applied.  Keep the dressing clean, dry, intact. -Well-padded below-knee fiberglass cast was applied making sure to pad all bony prominences. -Ice/elevation -Pain medication as needed. -NWB -Monitor for any clinical signs or symptoms of infection and DVT/PE and directed to call the office immediately should any occur or go to the ER. -Follow-up as scheduled for cast change and possible suture removal or sooner if any problems arise. In the meantime,  encouraged to call the office with any questions, concerns, change in symptoms.   [name], DPM

## 2019-05-27 ENCOUNTER — Telehealth (INDEPENDENT_AMBULATORY_CARE_PROVIDER_SITE_OTHER): Admitting: Internal Medicine

## 2019-05-27 ENCOUNTER — Other Ambulatory Visit: Payer: Self-pay

## 2019-05-27 ENCOUNTER — Encounter (INDEPENDENT_AMBULATORY_CARE_PROVIDER_SITE_OTHER): Payer: Self-pay | Admitting: Internal Medicine

## 2019-05-27 DIAGNOSIS — E282 Polycystic ovarian syndrome: Secondary | ICD-10-CM | POA: Diagnosis not present

## 2019-05-27 DIAGNOSIS — E6609 Other obesity due to excess calories: Secondary | ICD-10-CM

## 2019-05-27 DIAGNOSIS — Z6839 Body mass index (BMI) 39.0-39.9, adult: Secondary | ICD-10-CM | POA: Diagnosis not present

## 2019-05-27 DIAGNOSIS — R5381 Other malaise: Secondary | ICD-10-CM

## 2019-05-27 DIAGNOSIS — R5383 Other fatigue: Secondary | ICD-10-CM

## 2019-05-27 MED ORDER — NP THYROID 120 MG PO TABS: 120.0000 mg | ORAL_TABLET | Freq: Every day | ORAL | 3 refills | Status: DC

## 2019-05-27 NOTE — Progress Notes (Signed)
Metrics: Intervention Frequency ACO  Documented Smoking Status Yearly  Screened one or more times in 24 months  Cessation Counseling or  Active cessation medication Past 24 months  Past 24 months   Guideline developer: UpToDate (See UpToDate for funding source) Date Released: 2014       Wellness Office Visit  Subjective:  Patient ID: Wendy Watkins, female    DOB: [birth year]  Age: 43 y.o. MRN: [id number]  CC: This is an audio telemedicine visit with the permission of the patient who is at home and I am in my office.  I was easily able to recognize her voice. This visit is to follow-up regarding her thyroid optimization. HPI  On the last visit after blood work, I did increase the NP thyroid so that she will be taking 120 mg in the morning and 30 mg in the afternoon.  She has tolerated this very well and does not feel that it has helped her in any significant way in terms of how she feels with fatigue.  She has not been doing much in the way of intermittent fasting and has not lost any significant weight.  This is likely because she is recently had foot surgery and has been on opioids for pain. Past Medical History:  Diagnosis Date  . Anxiety   . Arthritis    hip  . Chiari I malformation ([place])   . Eczema   . GERD (gastroesophageal reflux disease)   . Heavy menstrual period   . History of iron deficiency anemia   . Hyperlipidemia   . Hypothyroidism   . Migraines   . Obesity   . PCOS (polycystic ovarian syndrome)   . PMS (premenstrual syndrome) 10/11/2016  . Urge incontinence   . Vitamin D deficiency   . Wears contact lenses   . Wears glasses   . Wears glasses       Family History  Problem Relation Age of Onset  . Hyperlipidemia Mother   . Hypertension Mother   . Arthritis Father   . Cancer Father        lymphoma  . Heart disease Maternal Grandmother        CHF  . Hyperlipidemia Maternal Grandmother   . Hypertension Maternal Grandmother   . Diabetes Maternal  Grandmother   . Cancer Paternal Grandmother        ovarian  . Kidney disease Paternal Grandfather   . Alcohol abuse Paternal Grandfather     Social History   Social History Narrative   Husband [employer] retired with PTSD   3 children at home in [employer] is in [place] to exercise   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   Social History   Tobacco Use  . Smoking status: Never Smoker  . Smokeless tobacco: Never Used  Substance Use Topics  . Alcohol use: Yes    Comment: socially wine    Current Meds  Medication Sig  . cephALEXin (KEFLEX) 500 MG capsule Take 1 capsule (500 mg total) by mouth 3 (three) times daily.  . Cholecalciferol (VITAMIN D3 PO) Take 10,000 Units by mouth daily.   Marland Kitchen ibuprofen (ADVIL) 800 MG tablet Take 1 tablet (800 mg total) by mouth every 8 (eight) hours as needed.  Marland Kitchen levonorgestrel (MIRENA) 20 MCG/24HR IUD by Intrauterine route.  . NP THYROID 120 MG tablet Take 1 tablet (120 mg total) by mouth daily before breakfast.  . oxyCODONE-acetaminophen (PERCOCET) 10-325 MG tablet Take 1  tablet by mouth every 4 (four) hours as needed for pain. MAXIMUM TOTAL ACETAMINOPHEN DOSE IS 4000 MG PER DAY  . promethazine (PHENERGAN) 25 MG tablet Take 1 tablet (25 mg total) by mouth every 8 (eight) hours as needed for nausea or vomiting.   This patient is being treated with desiccated thyroid, off label, for symptoms of thyroid deficiency.  The patient has been counseled regarding side effects and how to deal with them.   Objective:   Today's Vitals: There were no vitals taken for this visit. Vitals with BMI 05/27/2019 05/08/2019 05/08/2019  Height (No Data) - -  Weight (No Data) - -  BMI - - -  Systolic (No Data) - 133  Diastolic (No Data) - 69  Pulse - 94 92     Physical Exam  Speech was normal on the phone and she appeared to be alert and orientated.     Assessment   1. Class 2 obesity due to excess calories without serious comorbidity  with body mass index (BMI) of 39.0 to 39.9 in adult   2. PCOS (polycystic ovarian syndrome)   3. Malaise and fatigue       Tests ordered No orders of the defined types were placed in this encounter.    Plan: 1. I have recommended at this point to increase the NP thyroid so that she is taking 120 mg in the morning and a dose of 60 mg in the afternoon.  She has plenty of the NP thyroid 30 mg tablets so she will double up on this. 2. I encouraged her to go back to intermittent fasting and eating healthier as before.  Hopefully she can do this soon. 3. I will see her in a couple of months time to see how she is doing and we will check blood work at that time. 4. This phone call lasted 13 minutes today.   Meds ordered this encounter  Medications  . NP THYROID 120 MG tablet    Sig: Take 1 tablet (120 mg total) by mouth daily before breakfast.    Dispense:  30 tablet    Refill:  3    Nimish Normajean Glasgow, MD

## 2019-05-28 ENCOUNTER — Other Ambulatory Visit: Payer: Self-pay

## 2019-05-28 ENCOUNTER — Ambulatory Visit (INDEPENDENT_AMBULATORY_CARE_PROVIDER_SITE_OTHER): Admitting: Podiatry

## 2019-05-28 DIAGNOSIS — M2141 Flat foot [pes planus] (acquired), right foot: Secondary | ICD-10-CM

## 2019-05-28 DIAGNOSIS — M216X1 Other acquired deformities of right foot: Secondary | ICD-10-CM

## 2019-05-28 DIAGNOSIS — Z09 Encounter for follow-up examination after completed treatment for conditions other than malignant neoplasm: Secondary | ICD-10-CM

## 2019-06-04 ENCOUNTER — Ambulatory Visit (HOSPITAL_COMMUNITY)
Admission: RE | Admit: 2019-06-04 | Discharge: 2019-06-04 | Disposition: A | Discharge status: Home / Self Care | Source: Ambulatory Visit | Attending: Otolaryngology | Admitting: Otolaryngology

## 2019-06-04 ENCOUNTER — Other Ambulatory Visit: Payer: Self-pay

## 2019-06-04 DIAGNOSIS — H93A9 Pulsatile tinnitus, unspecified ear: Secondary | ICD-10-CM | POA: Diagnosis present

## 2019-06-05 NOTE — Progress Notes (Signed)
Subjective: Wendy Watkins is a 43 y.o. is seen today in office s/p right flatfoot reconstruction and left foot steroid injection on the hypertrophic scar preformed on 05/08/2019.  She presents today for cast change.  She says she is doing well not have any pain.  She does have numbness to her fifth toe.  No pain in the toe just numbness.  She denies any recent injury.  She has no concerns.  Denies any fevers, chills, nausea, vomiting.  No calf pain, chest pain, shortness of breath.   Objective: General: No acute distress, AAOx3  DP/PT pulses palpable 2/4, CRT < 3 sec to all digits.  Protective sensation intact. Motor function intact.  RIGHT foot: Incision is well coapted without any evidence of dehiscence with sutures, staples intact. There is no surrounding erythema, ascending cellulitis, fluctuance, crepitus, malodor, drainage/purulence. There is improving edema around the surgical site. There is no significant pain along the surgical site.  Incisions appear to be healing well without any signs of infection or dehiscence.  Not able to palpate calcaneal graft plantarly. No other areas of tenderness to bilateral lower extremities.  No other open lesions or pre-ulcerative lesions.  No pain with calf compression, swelling, warmth, erythema.   Assessment and Plan:  Status post right flatfoot reconstruction, doing well with no complications   -Treatment options discussed including all alternatives, risks, and complications -Incisions are healing well.  Antibiotic ointment and a dressing applied.  Keep the dressing clean, dry, intact. Well-padded below-knee fiberglass cast was applied making sure to pad all bony prominences. -Ice/elevation -Pain medication as needed -Discussed the need to watch the numbness.   -ASA 81mg  daily -NWB -Monitor for any clinical signs or symptoms of infection and DVT/PE and directed to call the office immediately should any occur or go to the ER. -Follow-up as  scheduled for cast change and possible suture removal or sooner if any problems arise. In the meantime, encouraged to call the office with any questions, concerns, change in symptoms.   [name], DPM

## 2019-06-10 ENCOUNTER — Encounter (INDEPENDENT_AMBULATORY_CARE_PROVIDER_SITE_OTHER): Payer: Self-pay | Admitting: Internal Medicine

## 2019-06-11 ENCOUNTER — Ambulatory Visit (INDEPENDENT_AMBULATORY_CARE_PROVIDER_SITE_OTHER): Admitting: Podiatry

## 2019-06-11 ENCOUNTER — Encounter: Payer: Self-pay | Admitting: Podiatry

## 2019-06-11 ENCOUNTER — Other Ambulatory Visit: Payer: Self-pay

## 2019-06-11 DIAGNOSIS — M2141 Flat foot [pes planus] (acquired), right foot: Secondary | ICD-10-CM | POA: Diagnosis not present

## 2019-06-11 DIAGNOSIS — M216X1 Other acquired deformities of right foot: Secondary | ICD-10-CM | POA: Diagnosis not present

## 2019-06-11 DIAGNOSIS — Z09 Encounter for follow-up examination after completed treatment for conditions other than malignant neoplasm: Secondary | ICD-10-CM

## 2019-06-17 ENCOUNTER — Encounter (INDEPENDENT_AMBULATORY_CARE_PROVIDER_SITE_OTHER): Payer: Self-pay | Admitting: Internal Medicine

## 2019-06-19 NOTE — Progress Notes (Signed)
Subjective: Wendy Watkins is a 43 y.o. is seen today in office s/p right flatfoot reconstruction and left foot steroid injection on the hypertrophic scar preformed on 05/08/2019.  Overall she states that she is doing well.  She states the numbness of the fifth toe is getting better.  She presents today for cast change and she has no other concerns today.  Denies any fevers, chills, nausea, vomiting.  No calf pain, chest pain, shortness of breath.   Objective: General: No acute distress, AAOx3  DP/PT pulses palpable 2/4, CRT < 3 sec to all digits.  Protective sensation intact. Motor function intact.  RIGHT foot: Incision is well coapted without any evidence of dehiscence with staples intact.  There is no surrounding erythema, ascending cellulitis and no drainage or pus or any signs of infection.  Minimal edema.  No significant discomfort on the surgical site. There is no pain with calf compression, erythema or warmth. No other areas of tenderness to bilateral lower extremities.  No other open lesions or pre-ulcerative lesions.  No pain with calf compression, swelling, warmth, erythema.   Assessment and Plan:  Status post right flatfoot reconstruction, doing well with no complications   -Treatment options discussed including all alternatives, risks, and complications -Cast was removed.  Incisions are healing well and staples removed.  Incision is healing well.  Antibiotic ointment was applied followed by dressing.  Well padded below-knee fiberglass cast was applied making sure to pad all bony prominences. -Ice/elevation -Pain medication as needed -Discussed the need to watch the numbness.   -ASA 81mg  daily -NWB -Monitor for any clinical signs or symptoms of infection and DVT/PE and directed to call the office immediately should any occur or go to the ER. -Follow-up as scheduled for cast change and possible suture removal or sooner if any problems arise. In the meantime, encouraged to call the  office with any questions, concerns, change in symptoms.   Return in about 2 weeks (around 06/25/2019). X-ray next appointment   Trula Slade DPM

## 2019-06-24 ENCOUNTER — Encounter (INDEPENDENT_AMBULATORY_CARE_PROVIDER_SITE_OTHER): Payer: Self-pay | Admitting: Internal Medicine

## 2019-06-24 ENCOUNTER — Other Ambulatory Visit (INDEPENDENT_AMBULATORY_CARE_PROVIDER_SITE_OTHER): Payer: Self-pay | Admitting: Internal Medicine

## 2019-06-24 MED ORDER — THYROID 60 MG PO TABS: 60.0000 mg | ORAL_TABLET | Freq: Every day | ORAL | 3 refills | Status: DC

## 2019-06-25 ENCOUNTER — Ambulatory Visit (INDEPENDENT_AMBULATORY_CARE_PROVIDER_SITE_OTHER)

## 2019-06-25 ENCOUNTER — Other Ambulatory Visit: Payer: Self-pay

## 2019-06-25 ENCOUNTER — Ambulatory Visit (INDEPENDENT_AMBULATORY_CARE_PROVIDER_SITE_OTHER): Admitting: Podiatry

## 2019-06-25 DIAGNOSIS — M216X1 Other acquired deformities of right foot: Secondary | ICD-10-CM | POA: Diagnosis not present

## 2019-07-01 NOTE — Progress Notes (Signed)
Subjective: Wendy Watkins is a 43 y.o. is seen today in office s/p right flatfoot reconstruction and left foot steroid injection on the hypertrophic scar preformed on 05/08/2019.  She said that she is feeling better and the pain is controlled.  The numbness of the fifth toe is getting better as well.  She has no new concerns.  She denies any recent injury or falls.  Denies any fevers, chills, nausea, vomiting.  No calf pain, chest pain, shortness of breath.    Objective: General: No acute distress, AAOx3  DP/PT pulses palpable 2/4, CRT < 3 sec to all digits.  Protective sensation intact. Motor function intact.  RIGHT foot: Incision is well coapted without any evidence of scar is forming.  There is mild edema but there is no erythema or warmth there is no drainage or possibly signs of infection.  Minimal tenderness palpation surgical site. No other areas of tenderness to bilateral lower extremities.  No other open lesions or pre-ulcerative lesions.  No pain with calf compression, swelling, warmth, erythema.   Assessment and Plan:  Status post right flatfoot reconstruction, doing well with no complications   -Treatment options discussed including all alternatives, risks, and complications -Repeat x-rays were obtained and reviewed.  The foot is rotated is difficult to fully evaluate the position but there does appear to be increased consolidation across the surgical sites.  Hardware intact in the calcaneus. -Cast was removed today.  She was transition to a cam boot.  Discussed gentle range of motion exercises.  Continue to ice elevate. -Numbness of the fifth toe is improving.  We will continue to monitor. -ASA 81mg  daily -She is started transition to partial weightbearing in the cam boot. -Monitor for any clinical signs or symptoms of infection and DVT/PE and directed to call the office immediately should any occur or go to the ER. -Follow-up as scheduled or sooner if any problems arise. In the  meantime, encouraged to call the office with any questions, concerns, change in symptoms.   Return in about 2 weeks (around 07/09/2019).  Trula Slade DPM

## 2019-07-08 ENCOUNTER — Ambulatory Visit (INDEPENDENT_AMBULATORY_CARE_PROVIDER_SITE_OTHER)

## 2019-07-08 ENCOUNTER — Ambulatory Visit (INDEPENDENT_AMBULATORY_CARE_PROVIDER_SITE_OTHER): Admitting: Podiatry

## 2019-07-08 ENCOUNTER — Other Ambulatory Visit: Payer: Self-pay | Admitting: Podiatry

## 2019-07-08 ENCOUNTER — Other Ambulatory Visit: Payer: Self-pay

## 2019-07-08 DIAGNOSIS — Z9889 Other specified postprocedural states: Secondary | ICD-10-CM | POA: Diagnosis not present

## 2019-07-08 DIAGNOSIS — Z09 Encounter for follow-up examination after completed treatment for conditions other than malignant neoplasm: Secondary | ICD-10-CM

## 2019-07-08 DIAGNOSIS — M2141 Flat foot [pes planus] (acquired), right foot: Secondary | ICD-10-CM

## 2019-07-08 DIAGNOSIS — M2142 Flat foot [pes planus] (acquired), left foot: Secondary | ICD-10-CM

## 2019-07-16 NOTE — Progress Notes (Signed)
Subjective: Wendy Watkins is a 44 y.o. is seen today in office s/p right flatfoot reconstruction and left foot steroid injection on the hypertrophic scar preformed on 05/08/2019.  She states that when she was having some pain last night she took pain medication it did help.  She also thinks that she may been on her feet more than she should have.  She still in the cam boot but she has been walking in the cam boot.  Denies any fevers, chills, nausea, vomiting.  No calf pain, chest pain, shortness of breath.    Objective: General: No acute distress, AAOx3  DP/PT pulses palpable 2/4, CRT < 3 sec to all digits.  Protective sensation intact. Motor function intact.  RIGHT foot: Incision is well coapted without any evidence of dehiscence and scar is formed.  There is mild but improved edema but there is no erythema or warmth or any clinical signs of infection.  Mild tenderness palpation of the surgical sites.  No other areas of tenderness to bilateral lower extremities.  No other open lesions or pre-ulcerative lesions.  No pain with calf compression, swelling, warmth, erythema.   Assessment and Plan:  Status post right flatfoot reconstruction, doing well with no complications   -Treatment options discussed including all alternatives, risks, and complications -Repeat x-rays were obtained and reviewed.  Hardware intact.  There is increased consolidation across the cotton osteotomy graft.  Still radiolucent line along the Evans graft but appears to be incorporating. -Continue in cam boot.  Start physical therapy and referral for [facility] was written today. -Continue to ice elevate -Pain medication as needed -Monitor for any clinical signs or symptoms of infection and DVT/PE and directed to call the office immediately should any occur or go to the ER. -Follow-up as scheduled or sooner if any problems arise. In the meantime, encouraged to call the office with any questions, concerns,  change in symptoms.   Return for 2-3 weeks for post-op. Repeat x-rays  Vivi Barrack DPM

## 2019-07-29 ENCOUNTER — Other Ambulatory Visit: Payer: Self-pay

## 2019-07-29 ENCOUNTER — Ambulatory Visit (INDEPENDENT_AMBULATORY_CARE_PROVIDER_SITE_OTHER)

## 2019-07-29 ENCOUNTER — Encounter: Payer: Self-pay | Admitting: Podiatry

## 2019-07-29 ENCOUNTER — Ambulatory Visit (INDEPENDENT_AMBULATORY_CARE_PROVIDER_SITE_OTHER): Admitting: Podiatry

## 2019-07-29 VITALS — Temp 97.9°F

## 2019-07-29 DIAGNOSIS — M216X1 Other acquired deformities of right foot: Secondary | ICD-10-CM

## 2019-07-29 DIAGNOSIS — Z09 Encounter for follow-up examination after completed treatment for conditions other than malignant neoplasm: Secondary | ICD-10-CM

## 2019-07-29 DIAGNOSIS — M2142 Flat foot [pes planus] (acquired), left foot: Secondary | ICD-10-CM

## 2019-07-29 DIAGNOSIS — M2141 Flat foot [pes planus] (acquired), right foot: Secondary | ICD-10-CM

## 2019-07-29 NOTE — Progress Notes (Signed)
Subjective: Wendy Watkins is a 44 y.o. is seen today in office s/p right flatfoot reconstruction and left foot steroid injection on the hypertrophic scar preformed on 05/08/2019.  She states that she is doing well.  She gets some stiffness in the morning and swelling at night but otherwise she is not having significant pain during the day.  She is doing physical therapy still.  She feels that she denies fainting at home that she can do by going. Denies any fevers, chills, nausea, vomiting.  No calf pain, chest pain, shortness of breath.    Objective: General: No acute distress, AAOx3  DP/PT pulses palpable 2/4, CRT < 3 sec to all digits.  Protective sensation intact. Motor function intact.  RIGHT foot: Incision is well coapted without any evidence of dehiscence and scar is formed.  Minimal discomfort of the surgical site however there is no pain upon palpation.  Trace edema.  There is no erythema or warmth. MMT 5/5, ROM intact.  No other open lesions or pre-ulcerative lesions.  No pain with calf compression, swelling, warmth, erythema.   Assessment and Plan:  Status post right flatfoot reconstruction, doing well with no complications   -Treatment options discussed including all alternatives, risks, and complications -Repeat x-rays were obtained and reviewed.  Hardware intact.  There is increased consolidation across the cotton osteotomy graft as well as the calcaneal graft.  No evidence of acute fracture. -Continue to ice elevate -Pain medication as needed -I discussed with physical therapy as well.  She is in a follow-up with them on Wednesday and transition to home program and she has been doing well. -Monitor for any clinical signs or symptoms of infection and DVT/PE and directed to call the office immediately should any occur or go to the ER. -Follow-up as scheduled or sooner if any problems arise. In the meantime, encouraged to call the office with any questions, concerns, change in  symptoms.   RTC 3 weeks or sooner if needed. Repeat x-rays  Vivi Barrack DPM

## 2019-07-31 ENCOUNTER — Ambulatory Visit: Payer: Medicaid Other | Attending: Internal Medicine

## 2019-07-31 ENCOUNTER — Ambulatory Visit (INDEPENDENT_AMBULATORY_CARE_PROVIDER_SITE_OTHER): Admitting: Internal Medicine

## 2019-07-31 ENCOUNTER — Other Ambulatory Visit: Payer: Self-pay

## 2019-07-31 DIAGNOSIS — Z20822 Contact with and (suspected) exposure to covid-19: Secondary | ICD-10-CM

## 2019-08-01 LAB — NOVEL CORONAVIRUS, NAA: SARS-CoV-2, NAA: NOT DETECTED

## 2019-08-19 ENCOUNTER — Ambulatory Visit (INDEPENDENT_AMBULATORY_CARE_PROVIDER_SITE_OTHER): Payer: Self-pay

## 2019-08-19 ENCOUNTER — Ambulatory Visit (INDEPENDENT_AMBULATORY_CARE_PROVIDER_SITE_OTHER): Admitting: Podiatry

## 2019-08-19 ENCOUNTER — Other Ambulatory Visit: Payer: Self-pay

## 2019-08-19 DIAGNOSIS — M216X1 Other acquired deformities of right foot: Secondary | ICD-10-CM

## 2019-08-19 DIAGNOSIS — M2141 Flat foot [pes planus] (acquired), right foot: Secondary | ICD-10-CM

## 2019-08-19 DIAGNOSIS — Z09 Encounter for follow-up examination after completed treatment for conditions other than malignant neoplasm: Secondary | ICD-10-CM

## 2019-08-19 DIAGNOSIS — M2142 Flat foot [pes planus] (acquired), left foot: Secondary | ICD-10-CM

## 2019-08-19 MED ORDER — DICLOFENAC SODIUM 1 % EX GEL: 2.0000 g | Freq: Four times a day (QID) | CUTANEOUS | 2 refills | Status: DC

## 2019-08-19 MED ORDER — IBUPROFEN 800 MG PO TABS: 800.0000 mg | ORAL_TABLET | Freq: Three times a day (TID) | ORAL | 0 refills | Status: DC | PRN

## 2019-08-27 NOTE — Progress Notes (Signed)
Subjective: Wendy Watkins is a 44 y.o. is seen today in office s/p right flatfoot reconstruction and left foot steroid injection on the hypertrophic scar preformed on 05/08/2019.  She states that about 2 weeks ago when she started transition into the brace she started to have increased discomfort on the lateral aspect of the foot.  She gets numb, aching sensation.  She denies any recent injury.  Because of this she is transition back into the cam boot.  Left foot is been doing well.  Denies any fevers, chills, nausea, vomiting.  No calf pain, chest pain, shortness of breath.    Objective: General: No acute distress, AAOx3-frustrated with the right foot still causing discomfort. DP/PT pulses palpable 2/4, CRT < 3 sec to all digits.  Protective sensation intact. Motor function intact.  RIGHT foot: Incision is well coapted without any evidence of dehiscence and scar is formed.  There is mild discomfort of the surgical site on the lateral aspect of the foot along the heel.  No other significant area discomfort.  Mild hypertrophic scars present.  Overall strength appears to be intact the peroneal tendon appears to be intact.  Graft is not palpable on the lateral foot on the evidence. No pain with calf compression, swelling, warmth, erythema.   Assessment and Plan:  Status post right flatfoot reconstruction, doing well with no complications   -Treatment options discussed including all alternatives, risks, and complications -X-rays obtained and reviewed.  Graft in place with still mild radiolucency along the Evans graft.  No evidence of acute fracture otherwise. -The brace may be causing irritation.  I want her to continue strengthening, range of motion exercises as she starts to feel better transition to regular shoe without the ankle brace to see if this does better but continue with the compression anklet.  Also she was measured for orthotics today.  Hopefully this will help support her foot and take  some pressure off the lateral aspect. -Prescribed ibuprofen and milligrams to take as needed and she can also use Voltaren gel intermittently  Return in about 3 weeks (around 09/09/2019).  Vivi Barrack DPM

## 2019-09-07 ENCOUNTER — Other Ambulatory Visit (INDEPENDENT_AMBULATORY_CARE_PROVIDER_SITE_OTHER): Payer: Self-pay | Admitting: Internal Medicine

## 2019-09-10 ENCOUNTER — Other Ambulatory Visit (INDEPENDENT_AMBULATORY_CARE_PROVIDER_SITE_OTHER): Payer: Self-pay | Admitting: Internal Medicine

## 2019-09-10 ENCOUNTER — Encounter (INDEPENDENT_AMBULATORY_CARE_PROVIDER_SITE_OTHER): Payer: Self-pay | Admitting: Internal Medicine

## 2019-09-11 ENCOUNTER — Other Ambulatory Visit (INDEPENDENT_AMBULATORY_CARE_PROVIDER_SITE_OTHER): Payer: Self-pay

## 2019-09-11 MED ORDER — FLUOXETINE HCL 20 MG PO CAPS: 20.0000 mg | ORAL_CAPSULE | Freq: Every day | ORAL | 0 refills | Status: DC

## 2019-09-11 NOTE — Telephone Encounter (Signed)
Pt sent mychart request for a refill of Prozac.

## 2019-09-11 NOTE — Telephone Encounter (Signed)
refill 

## 2019-09-16 ENCOUNTER — Other Ambulatory Visit: Payer: Self-pay

## 2019-09-16 ENCOUNTER — Encounter (INDEPENDENT_AMBULATORY_CARE_PROVIDER_SITE_OTHER): Payer: Self-pay | Admitting: Internal Medicine

## 2019-09-16 ENCOUNTER — Ambulatory Visit (INDEPENDENT_AMBULATORY_CARE_PROVIDER_SITE_OTHER): Payer: Self-pay | Admitting: Internal Medicine

## 2019-09-16 VITALS — BP 128/74 | HR 70 | Temp 98.4°F | Resp 18 | Ht 64.0 in | Wt 240.0 lb

## 2019-09-16 DIAGNOSIS — E559 Vitamin D deficiency, unspecified: Secondary | ICD-10-CM

## 2019-09-16 DIAGNOSIS — R5383 Other fatigue: Secondary | ICD-10-CM

## 2019-09-16 DIAGNOSIS — E282 Polycystic ovarian syndrome: Secondary | ICD-10-CM

## 2019-09-16 DIAGNOSIS — E6609 Other obesity due to excess calories: Secondary | ICD-10-CM

## 2019-09-16 DIAGNOSIS — R5381 Other malaise: Secondary | ICD-10-CM

## 2019-09-16 DIAGNOSIS — Z6839 Body mass index (BMI) 39.0-39.9, adult: Secondary | ICD-10-CM

## 2019-09-16 NOTE — Progress Notes (Signed)
Metrics: Intervention Frequency ACO  Documented Smoking Status Yearly  Screened one or more times in 24 months  Cessation Counseling or  Active cessation medication Past 24 months  Past 24 months   Guideline developer: UpToDate (See UpToDate for funding source) Date Released: 2014       Wellness Office Visit  Subjective:  Patient ID: Wendy Watkins, female    DOB: 03-02-1976  Age: 44 y.o. MRN: [id number]  CC: This lady comes in for follow-up of PCOS, obesity, vitamin D deficiency. HPI  Overall she has had a challenging time because of foot surgery more recently. She did tolerate the higher dose of desiccated NP thyroid without any side effects. She continues to take vitamin D3 supplementation for vitamin D deficiency. She has not really had a chance to work on nutrition too much lately but she intends to follow a plan which involves a company sending her food for her at home. Past Medical History:  Diagnosis Date  . Anxiety   . Arthritis    hip  . Chiari I malformation (HCC)   . Eczema   . GERD (gastroesophageal reflux disease)   . Heavy menstrual period   . History of iron deficiency anemia   . Hyperlipidemia   . Hypothyroidism   . Migraines   . Obesity   . PCOS (polycystic ovarian syndrome)   . PMS (premenstrual syndrome) 10/11/2016  . Urge incontinence   . Vitamin D deficiency   . Wears contact lenses   . Wears glasses   . Wears glasses       Family History  Problem Relation Age of Onset  . Hyperlipidemia Mother   . Hypertension Mother   . Arthritis Father   . Cancer Father        lymphoma  . Heart disease Maternal Grandmother        CHF  . Hyperlipidemia Maternal Grandmother   . Hypertension Maternal Grandmother   . Diabetes Maternal Grandmother   . Cancer Paternal Grandmother        ovarian  . Kidney disease Paternal Grandfather   . 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 Smoking status: Never Smoker  . Smokeless tobacco: Never Used  Substance Use Topics  . Alcohol use: Yes    Comment: socially wine    Current Meds  Medication Sig  . Cholecalciferol (VITAMIN D3 PO) Take 10,000 Units by mouth daily.   . clobetasol ointment (TEMOVATE) 0.05 % APPLY OINTMENT TOPICALLY TO SKIN TWICE DAILY FOR 14 DAYS  . diclofenac Sodium (VOLTAREN) 1 % GEL Apply 2 g topically 4 (four) times daily. Rub into affected area of foot 2 to 4 times daily  . FLUoxetine (PROZAC) 20 MG capsule Take 1 capsule (20 mg total) by mouth daily.  Marland Kitchen ibuprofen (ADVIL) 800 MG tablet Take 1 tablet (800 mg total) by mouth every 8 (eight) hours as needed.  Marland Kitchen levonorgestrel (MIRENA) 20 MCG/24HR IUD by Intrauterine route.  . NP THYROID 120 MG tablet Take 1 tablet (120 mg total) by mouth daily before breakfast.  . thyroid (NP THYROID) 60 MG tablet Take 1 tablet (60 mg total) by mouth daily before breakfast.  Objective:   Today's Vitals: BP 128/74 (BP Location: Right Arm, Patient Position: Sitting, Cuff Size: Normal)   Pulse 70   Temp 98.4 F (36.9 C) (Temporal)   Resp 18   Ht 5\' 4"  (1.626 m)   Wt 240 lb (108.9 kg)   SpO2 99% Comment: wearing mask  BMI 41.20 kg/m  Vitals with BMI 09/16/2019 05/27/2019 05/08/2019  Height 5\' 4"  (No Data) -  Weight 240 lbs (No Data) -  BMI 53.29 - -  Systolic 924 (No Data) -  Diastolic 74 (No Data) -  Pulse 70 - 94     Physical Exam    She looks systemically well.  She remains morbidly obese.   Assessment   1. PCOS (polycystic ovarian syndrome)   2. Class 2 obesity due to excess calories without serious comorbidity with body mass index (BMI) of 39.0 to 39.9 in adult   3. Vitamin D deficiency disease   4. Malaise and fatigue       Tests ordered Orders Placed This  Encounter  Procedures  . COMPLETE METABOLIC PANEL WITH GFR  . T3, free  . TSH  . VITAMIN D 25 Hydroxy (Vit-D Deficiency, Fractures)     Plan: 1. Blood work is ordered above. 2. She will continue with desiccated thyroid and we may need to adjust the dose further. 3. She will continue with vitamin D3 supplementation for vitamin D deficiency and I will see what her numbers show. 4. Further recommendations will depend on blood results and I will see her in 6 weeks time for close follow-up.   No orders of the defined types were placed in this encounter.   Doree Albee, MD

## 2019-09-16 NOTE — Patient Instructions (Signed)
[facility] Dietary Recommendations for Weight Loss What to Avoid . Avoid added sugars o Often added sugar can be found in processed foods such as many condiments, dry cereals, cakes, cookies, chips, crisps, crackers, candies, sweetened drinks, etc.  o Read labels and AVOID/DECREASE use of foods with the following in their ingredient list: Sugar, fructose, high fructose corn syrup, sucrose, glucose, maltose, dextrose, molasses, cane sugar, brown sugar, any type of syrup, agave nectar, etc.   . Avoid snacking in between meals . Avoid foods made with flour o If you are going to eat food made with flour, choose those made with whole-grains; and, minimize your consumption as much as is tolerable . Avoid processed foods o These foods are generally stocked in the middle of the grocery store. Focus on shopping on the perimeter of the grocery.  . Avoid Meat  o We recommend following a plant-based diet at [facility]. Thus, we recommend avoiding meat as a general rule. Consider eating beans, legumes, eggs, and/or dairy products for regular protein sources o If you plan on eating meat limit to 4 ounces of meat at a time and choose lean options such as Fish, chicken, turkey. Avoid red meat intake such as pork and/or steak What to Include . Vegetables o GREEN LEAFY VEGETABLES: Kale, spinach, mustard greens, collard greens, cabbage, broccoli, etc. o OTHER: Asparagus, cauliflower, eggplant, carrots, peas, Brussel sprouts, tomatoes, bell peppers, zucchini, beets, cucumbers, etc. . Grains, seeds, and legumes o Beans: kidney beans, black eyed peas, garbanzo beans, black beans, pinto beans, etc. o Whole, unrefined grains: brown rice, barley, bulgur, oatmeal, etc. . Healthy fats  o Avoid highly processed fats such as vegetable oil o Examples of healthy fats: avocado, olives, virgin olive oil, dark chocolate (?72% Cocoa), nuts (peanuts, almonds, walnuts, cashews, pecans, etc.) . None to Low  Intake of Animal Sources of Protein o Meat sources: chicken, turkey, salmon, tuna. Limit to 4 ounces of meat at one time. o Consider limiting dairy sources, but when choosing dairy focus on: PLAIN Greek yogurt, cottage cheese, high-protein milk . Fruit o Choose berries  When to Eat . Intermittent Fasting: o Choosing not to eat for a specific time period, but DO FOCUS ON HYDRATION when fasting o Multiple Techniques: - Time Restricted Eating: eat 3 meals in a day, each meal lasting no more than 60 minutes, no snacks between meals - 16-18 hour fast: fast for 16 to 18 hours up to 7 days a week. Often suggested to start with 2-3 nonconsecutive days per week.  . Remember the time you sleep is counted as fasting.  . Examples of eating schedule: Fast from 7:00pm-11:00am. Eat between 11:00am-7:00pm.  - 24-hour fast: fast for 24 hours up to every other day. Often suggested to start with 1 day per week . Remember the time you sleep is counted as fasting . Examples of eating schedule:  o Eating day: eat 2-3 meals on your eating day. If doing 2 meals, each meal should last no more than 90 minutes. If doing 3 meals, each meal should last no more than 60 minutes. Finish last meal by 7:00pm. o Fasting day: Fast until 7:00pm.  o IF YOU FEEL UNWELL FOR ANY REASON/IN ANY WAY WHEN FASTING, STOP FASTING BY EATING A NUTRITIOUS SNACK OR LIGHT MEAL o ALWAYS FOCUS ON HYDRATION DURING FASTS - Acceptable Hydration sources: water, broths, tea/coffee (black tea/coffee is best but using a small amount of whole-fat dairy products in coffee/tea is acceptable).  -   Poor Hydration Sources: anything with sugar or artificial sweeteners added to it  These recommendations have been developed for patients that are actively receiving medical care from either Dr. Rossi Burdo or Sarah Gray, DNP, NP-C at [facility]. These recommendations are developed for patients with specific medical conditions and are not meant to be  distributed or used by others that are not actively receiving care from either provider listed above at [facility]. It is not appropriate to participate in the above eating plans without proper medical supervision.   Reference: Fung, J. The obesity code. [place]: [place]; 2016.   

## 2019-09-17 LAB — T3, FREE: T3, Free: 3.6 pg/mL (ref 2.3–4.2)

## 2019-09-17 LAB — COMPLETE METABOLIC PANEL WITH GFR
AG Ratio: 1.2 (calc) (ref 1.0–2.5)
ALT: 9 U/L (ref 6–29)
AST: 14 U/L (ref 10–30)
Albumin: 4.3 g/dL (ref 3.6–5.1)
Alkaline phosphatase (APISO): 148 U/L — ABNORMAL HIGH (ref 31–125)
BUN: 11 mg/dL (ref 7–25)
CO2: 27 mmol/L (ref 20–32)
Calcium: 9.7 mg/dL (ref 8.6–10.2)
Chloride: 100 mmol/L (ref 98–110)
Creat: 0.7 mg/dL (ref 0.50–1.10)
GFR, Est African American: 123 mL/min/{1.73_m2} (ref >=60)
GFR, Est Non African American: 106 mL/min/{1.73_m2} (ref >=60)
Globulin: 3.5 g/dL (calc) (ref 1.9–3.7)
Glucose, Bld: 86 mg/dL (ref 65–99)
Potassium: 4.2 mmol/L (ref 3.5–5.3)
Sodium: 138 mmol/L (ref 135–146)
Total Bilirubin: 0.3 mg/dL (ref 0.2–1.2)
Total Protein: 7.8 g/dL (ref 6.1–8.1)

## 2019-09-17 LAB — TSH: TSH: 0.01 mIU/L — ABNORMAL LOW

## 2019-09-17 LAB — VITAMIN D 25 HYDROXY (VIT D DEFICIENCY, FRACTURES): Vit D, 25-Hydroxy: 84 ng/mL (ref 30–100)

## 2019-09-18 ENCOUNTER — Encounter: Payer: Self-pay | Admitting: Podiatry

## 2019-09-18 ENCOUNTER — Other Ambulatory Visit: Payer: Self-pay | Admitting: Podiatry

## 2019-09-18 MED ORDER — IBUPROFEN 800 MG PO TABS: 800.0000 mg | ORAL_TABLET | Freq: Three times a day (TID) | ORAL | 0 refills | Status: DC | PRN

## 2019-09-20 ENCOUNTER — Ambulatory Visit: Payer: Medicaid Other | Admitting: Orthotics

## 2019-09-20 ENCOUNTER — Other Ambulatory Visit: Payer: Self-pay

## 2019-09-20 DIAGNOSIS — M216X1 Other acquired deformities of right foot: Secondary | ICD-10-CM

## 2019-09-20 DIAGNOSIS — M2142 Flat foot [pes planus] (acquired), left foot: Secondary | ICD-10-CM

## 2019-09-20 DIAGNOSIS — M2141 Flat foot [pes planus] (acquired), right foot: Secondary | ICD-10-CM

## 2019-09-20 NOTE — Progress Notes (Signed)
Patient came in today to pick up custom made foot orthotics.  The goals were accomplished and the patient reported no dissatisfaction with said orthotics.  Patient was advised of breakin period and how to report any issues. 

## 2019-09-25 ENCOUNTER — Other Ambulatory Visit (INDEPENDENT_AMBULATORY_CARE_PROVIDER_SITE_OTHER): Payer: Self-pay | Admitting: Internal Medicine

## 2019-09-29 ENCOUNTER — Encounter: Payer: Self-pay | Admitting: Podiatry

## 2019-10-07 ENCOUNTER — Ambulatory Visit: Payer: Medicaid Other

## 2019-10-07 ENCOUNTER — Other Ambulatory Visit: Payer: Self-pay

## 2019-10-07 ENCOUNTER — Ambulatory Visit: Payer: Medicaid Other | Admitting: Podiatry

## 2019-10-07 ENCOUNTER — Other Ambulatory Visit (INDEPENDENT_AMBULATORY_CARE_PROVIDER_SITE_OTHER): Payer: Self-pay | Admitting: Internal Medicine

## 2019-10-07 VITALS — Temp 97.2°F

## 2019-10-07 DIAGNOSIS — M2141 Flat foot [pes planus] (acquired), right foot: Secondary | ICD-10-CM

## 2019-10-07 DIAGNOSIS — M2142 Flat foot [pes planus] (acquired), left foot: Secondary | ICD-10-CM

## 2019-10-07 DIAGNOSIS — M216X1 Other acquired deformities of right foot: Secondary | ICD-10-CM

## 2019-10-07 DIAGNOSIS — L910 Hypertrophic scar: Secondary | ICD-10-CM

## 2019-10-15 NOTE — Progress Notes (Signed)
Subjective: Wendy Watkins is a 44 y.o. is seen today in office s/p right flatfoot reconstruction and left foot steroid injection on the hypertrophic scar preformed on 05/08/2019.  She states that she is doing better.  She feels that the scar is causing discomfort she was to proceed with a steroid injection of the scar to the lateral aspect of the right side.  She is back to wearing a regular shoe which has been wearing the orthotics.  She does get achiness after being on her feet as well as intermittent numbness.  She describes them as a pinching sensation in the office aspect of the right foot. Denies any fevers, chills, nausea, vomiting.  No calf pain, chest pain, shortness of breath.    Objective: General: No acute distress, AAOx3-frustrated with the right foot still causing discomfort. DP/PT pulses palpable 2/4, CRT < 3 sec to all digits.  Protective sensation intact. Motor function intact.  RIGHT foot: Incision is well coapted without any evidence of dehiscence and scar is formed.  The scar is hypertrophic and she feels that the scars was causing discomfort.  Sensation tactile sometimes the monofilament but describing some numbness to the lateral aspect of foot.  No area of pinpoint tenderness.   On the left foot no significant tenderness on exam.   No pain with calf compression, swelling, warmth, erythema.   Assessment and Plan:  Status post right flatfoot reconstruction, doing well with no complications   -Treatment options discussed including all alternatives, risks, and complications -X-rays obtained and reviewed.  Graft in place with still mild radiolucency along the Evans graft however there is increased consolidation across the graft site. -Steroid injection performed under the scar.  Skin was cleaned alcohol and a mixture of 1 cc Kenalog 10, 0.5 cc of Marcaine plain, 0.5 cc of lidocaine plain was infiltrated subdermally under the area the hypertrophic scar to the lateral aspect of  right foot.  She tolerated well any complications. -Ibuprofen as needed  Vivi Barrack DPM

## 2019-10-20 ENCOUNTER — Other Ambulatory Visit (INDEPENDENT_AMBULATORY_CARE_PROVIDER_SITE_OTHER): Payer: Self-pay | Admitting: Internal Medicine

## 2019-10-28 ENCOUNTER — Other Ambulatory Visit: Payer: Self-pay

## 2019-10-28 ENCOUNTER — Ambulatory Visit (INDEPENDENT_AMBULATORY_CARE_PROVIDER_SITE_OTHER): Admitting: Internal Medicine

## 2019-10-28 ENCOUNTER — Encounter (INDEPENDENT_AMBULATORY_CARE_PROVIDER_SITE_OTHER): Payer: Self-pay | Admitting: Internal Medicine

## 2019-10-28 VITALS — BP 100/70 | HR 84 | Temp 97.3°F | Ht 64.0 in | Wt 233.6 lb

## 2019-10-28 DIAGNOSIS — Z6839 Body mass index (BMI) 39.0-39.9, adult: Secondary | ICD-10-CM | POA: Diagnosis not present

## 2019-10-28 DIAGNOSIS — R5381 Other malaise: Secondary | ICD-10-CM

## 2019-10-28 DIAGNOSIS — R5383 Other fatigue: Secondary | ICD-10-CM

## 2019-10-28 DIAGNOSIS — E6609 Other obesity due to excess calories: Secondary | ICD-10-CM | POA: Diagnosis not present

## 2019-10-28 DIAGNOSIS — E282 Polycystic ovarian syndrome: Secondary | ICD-10-CM

## 2019-10-28 NOTE — Progress Notes (Signed)
Metrics: Intervention Frequency ACO  Documented Smoking Status Yearly  Screened one or more times in 24 months  Cessation Counseling or  Active cessation medication Past 24 months  Past 24 months   Guideline developer: UpToDate (See UpToDate for funding source) Date Released: 2014       Wellness Office Visit  Subjective:  Patient ID: Wendy Watkins, female    DOB: September 10, 1975  Age: 44 y.o. MRN: [id number]  CC: This lady comes in for follow-up of obesity, PCOS, symptoms of thyroid deficiency. HPI  She is started nutrition plan for the last couple of weeks and is losing weight.  She feels better.  On the last visit, her T3 levels had increased but are still not optimal.  She is tolerating the current dose of NP thyroid which is a total dose of 180 mg daily. She does still describe degree of fatigue. Past Medical History:  Diagnosis Date  . Anxiety   . Arthritis    hip  . Chiari I malformation (HCC)   . Eczema   . GERD (gastroesophageal reflux disease)   . Heavy menstrual period   . History of iron deficiency anemia   . Hyperlipidemia   . Hypothyroidism   . Migraines   . Obesity   . PCOS (polycystic ovarian syndrome)   . PMS (premenstrual syndrome) 10/11/2016  . Urge incontinence   . Vitamin D deficiency   . Wears contact lenses   . Wears glasses   . Wears glasses       Family History  Problem Relation Age of Onset  . Hyperlipidemia Mother   . Hypertension Mother   . Arthritis Father   . Cancer Father        lymphoma  . Heart disease Maternal Grandmother        CHF  . Hyperlipidemia Maternal Grandmother   . Hypertension Maternal Grandmother   . Diabetes Maternal Grandmother   . Cancer Paternal Grandmother        ovarian  . Kidney disease Paternal Grandfather   . 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 Smoking status: Never Smoker  . Smokeless tobacco: Never Used  Substance Use Topics  . Alcohol use: Yes    Comment: socially wine    Current Meds  Medication Sig  . Cholecalciferol (VITAMIN D3 PO) Take 10,000 Units by mouth daily.   . clobetasol ointment (TEMOVATE) 0.05 % APPLY OINTMENT TOPICALLY TO SKIN TWICE DAILY FOR 14 DAYS  . diclofenac Sodium (VOLTAREN) 1 % GEL Apply 2 g topically 4 (four) times daily. Rub into affected area of foot 2 to 4 times daily  . FLUoxetine (PROZAC) 20 MG capsule Take 1 capsule by mouth once daily  . ibuprofen (ADVIL) 800 MG tablet Take 1 tablet (800 mg total) by mouth every 8 (eight) hours as needed.  Marland Kitchen levonorgestrel (MIRENA) 20 MCG/24HR IUD by Intrauterine route.  . NP THYROID 120 MG tablet Take 1 tablet by mouth once daily  . NP THYROID 60 MG tablet TAKE 1 TABLET BY MOUTH DAILY BEFORE BREAKFAST       Objective:   Today's Vitals: BP 100/70 (BP Location: Left Arm, Patient Position: Sitting, Cuff Size:  Normal)   Pulse 84   Temp (!) 97.3 F (36.3 C) (Temporal)   Ht 5\' 4"  (1.626 m)   Wt 233 lb 9.6 oz (106 kg)   SpO2 95%   BMI 40.10 kg/m  Vitals with BMI 10/28/2019 09/16/2019 05/27/2019  Height 5\' 4"  5\' 4"  (No Data)  Weight 233 lbs 10 oz 240 lbs (No Data)  BMI 75.10 25.85 -  Systolic 277 824 (No Data)  Diastolic 70 74 (No Data)  Pulse 84 70 -     Physical Exam   She looks systemically well.  She has managed to lose 7 pounds in the last 6 weeks or so which is great.    Assessment   1. PCOS (polycystic ovarian syndrome)   2. Class 2 obesity due to excess calories without serious comorbidity with body mass index (BMI) of 39.0 to 39.9 in adult   3. Malaise and fatigue       Tests ordered No orders of the defined types were placed in this encounter.    Plan: 1. I recommended that we increase the dose of NP thyroid so that she will be  taking NP thyroid 120 mg in the morning and NP thyroid 90 mg at lunchtime.  I have given her samples of NP thyroid 90 mg tablets to see how she gets along with this. 2. I will see her in 6 weeks time to see how she is doing and we will do blood work then.   No orders of the defined types were placed in this encounter.   Doree Albee, MD

## 2019-10-29 ENCOUNTER — Ambulatory Visit: Payer: Medicaid Other | Admitting: Podiatry

## 2019-11-18 ENCOUNTER — Encounter (INDEPENDENT_AMBULATORY_CARE_PROVIDER_SITE_OTHER): Payer: Self-pay | Admitting: Internal Medicine

## 2019-11-25 ENCOUNTER — Ambulatory Visit (INDEPENDENT_AMBULATORY_CARE_PROVIDER_SITE_OTHER): Admitting: Podiatry

## 2019-11-25 ENCOUNTER — Encounter: Payer: Self-pay | Admitting: Podiatry

## 2019-11-25 ENCOUNTER — Other Ambulatory Visit: Payer: Self-pay

## 2019-11-25 DIAGNOSIS — L910 Hypertrophic scar: Secondary | ICD-10-CM | POA: Diagnosis not present

## 2019-11-25 DIAGNOSIS — M779 Enthesopathy, unspecified: Secondary | ICD-10-CM

## 2019-11-25 NOTE — Progress Notes (Signed)
Subjective: KESHANNA RISO is a 44 y.o. is seen today in office s/p right flatfoot reconstruction and left foot steroid injection on the hypertrophic scar preformed on 05/08/2019.   She states that overall she started to get around better and doing more activity.  She feels it is more the scar is causing discomfort.  Injection was helpful she is asking another one today. Denies any fevers, chills, nausea, vomiting.  No calf pain, chest pain, shortness of breath.    Objective: General: No acute distress, AAOx3-frustrated with the right foot still causing discomfort. DP/PT pulses palpable 2/4, CRT < 3 sec to all digits.  Protective sensation intact. Motor function intact.  RIGHT foot: Incision is well coapted without any evidence of dehiscence and scar is formed.  The scar is hypertrophic.  Tenderness directly on the scar.  There is no edema, erythema.  Flexor, extensor tendons appear to be intact. On the left foot no significant tenderness on exam.   No pain with calf compression, swelling, warmth, erythema.   Assessment and Plan:  Status post right flatfoot reconstruction, doing well with no complications   -Treatment options discussed including all alternatives, risks, and complications -Steroid injection performed under the scar.  Skin was cleaned alcohol and a mixture of 1 cc Kenalog 10, 0.5 cc of Marcaine plain, 0.5 cc of lidocaine plain was infiltrated subdermally under the area the hypertrophic scar to the lateral aspect of right foot.  She tolerated well any complications. -Ibuprofen as needed  Vivi Barrack DPM

## 2019-12-04 ENCOUNTER — Encounter (INDEPENDENT_AMBULATORY_CARE_PROVIDER_SITE_OTHER): Payer: Self-pay | Admitting: Internal Medicine

## 2019-12-09 ENCOUNTER — Ambulatory Visit (INDEPENDENT_AMBULATORY_CARE_PROVIDER_SITE_OTHER): Admitting: Internal Medicine

## 2019-12-10 ENCOUNTER — Ambulatory Visit (INDEPENDENT_AMBULATORY_CARE_PROVIDER_SITE_OTHER): Admitting: Internal Medicine

## 2019-12-10 ENCOUNTER — Encounter (INDEPENDENT_AMBULATORY_CARE_PROVIDER_SITE_OTHER): Payer: Self-pay | Admitting: Internal Medicine

## 2019-12-10 ENCOUNTER — Other Ambulatory Visit: Payer: Self-pay

## 2019-12-10 VITALS — BP 105/75 | HR 97 | Temp 97.8°F | Ht 64.0 in | Wt 220.8 lb

## 2019-12-10 DIAGNOSIS — E282 Polycystic ovarian syndrome: Secondary | ICD-10-CM | POA: Diagnosis not present

## 2019-12-10 DIAGNOSIS — R5381 Other malaise: Secondary | ICD-10-CM | POA: Diagnosis not present

## 2019-12-10 DIAGNOSIS — E66812 Other obesity due to excess calories: Secondary | ICD-10-CM

## 2019-12-10 DIAGNOSIS — E559 Vitamin D deficiency, unspecified: Secondary | ICD-10-CM | POA: Diagnosis not present

## 2019-12-10 DIAGNOSIS — R5383 Other fatigue: Secondary | ICD-10-CM

## 2019-12-10 DIAGNOSIS — Z6839 Body mass index (BMI) 39.0-39.9, adult: Secondary | ICD-10-CM

## 2019-12-10 DIAGNOSIS — E6609 Other obesity due to excess calories: Secondary | ICD-10-CM

## 2019-12-10 MED ORDER — NP THYROID 120 MG PO TABS: 120.0000 mg | ORAL_TABLET | Freq: Two times a day (BID) | ORAL | 0 refills | Status: DC

## 2019-12-10 NOTE — Progress Notes (Signed)
Metrics: Intervention Frequency ACO  Documented Smoking Status Yearly  Screened one or more times in 24 months  Cessation Counseling or  Active cessation medication Past 24 months  Past 24 months   Guideline developer: UpToDate (See UpToDate for funding source) Date Released: 2014       Wellness Office Visit  Subjective:  Patient ID: Wendy Watkins, female    DOB: May 06, 1976  Age: 44 y.o. MRN: [id number]  CC: This lady comes in for follow-up of obesity, PCOS and symptoms relating to thyroid deficiency. HPI  She has done extremely well since the last time I saw her.  She is on her nutrition plan in which she eats 5 bar of a meal plan every day.  She has lost further weight. I increased her desiccated NP thyroid but she sees no improvement in energy levels. Past Medical History:  Diagnosis Date  . Anxiety   . Arthritis    hip  . Chiari I malformation ([place])   . Eczema   . GERD (gastroesophageal reflux disease)   . Heavy menstrual period   . History of iron deficiency anemia   . Hyperlipidemia   . Hypothyroidism   . Migraines   . Obesity   . PCOS (polycystic ovarian syndrome)   . PMS (premenstrual syndrome) 10/11/2016  . Urge incontinence   . Vitamin D deficiency   . Wears contact lenses   . Wears glasses   . Wears glasses    Past Surgical History:  Procedure Laterality Date  . CALCANEAL OSTEOTOMY Left 12/05/2018   Procedure: EVANCALCANEAL OSTEOTOMY;  Surgeon: Trula Slade, DPM;  Location: [facility];  Service: Podiatry;  Laterality: Left;  . CALCANEAL OSTEOTOMY Right 05/08/2019   Procedure: EVAN CALCANEAL OSTEOTOMY;  Surgeon: Trula Slade, DPM;  Location: [place];  Service: Podiatry;  Laterality: Right;  . FLAT FOOT RECONSTRUCTION-TAL GASTROC RECESSION Right 05/08/2019   Procedure: FLAT FOOT RECONSTRUCTION-TAL GASTROC RECESSION;  Surgeon: Trula Slade, DPM;  Location: [facility];  Service: Podiatry;   Laterality: Right;  . GASTROC RECESSION EXTREMITY Left 12/05/2018   Procedure: GASTROC RECESSION EXTREMITY;  Surgeon: Trula Slade, DPM;  Location: [place];  Service: Podiatry;  Laterality: Left;  . OSTECTOMY Left 12/05/2018   Procedure: COTTON OSTEOTOMY, MEDIAL CALCANEAL SLIDE OSTEOTOMY, MANIPULATION OF THE ANKLE UNDER ANESTHESIA;  Surgeon: Trula Slade, DPM;  Location: [place];  Service: Podiatry;  Laterality: Left;  . STERIOD INJECTION Left 05/08/2019   Procedure: STEROID INJECTION;  Surgeon: Trula Slade, DPM;  Location: [place];  Service: Podiatry;  Laterality: Left;  . TUBAL LIGATION  2003  . WISDOM TOOTH EXTRACTION       Family History  Problem Relation Age of Onset  . Hyperlipidemia Mother   . Hypertension Mother   . Arthritis Father   . Cancer Father        lymphoma  . Heart disease Maternal Grandmother        CHF  . Hyperlipidemia Maternal Grandmother   . Hypertension Maternal Grandmother   . Diabetes Maternal Grandmother   . Cancer Paternal Grandmother        ovarian  . Kidney disease Paternal Grandfather   . 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 Smoking status: Never Smoker  . Smokeless tobacco: Never Used  Substance Use Topics  . Alcohol use: Yes    Comment: socially wine    Current Meds  Medication Sig  . Cholecalciferol (VITAMIN D3 PO) Take 10,000 Units by mouth daily.   . clobetasol ointment (TEMOVATE) 0.05 % APPLY OINTMENT TOPICALLY TO SKIN TWICE DAILY FOR 14 DAYS  . diclofenac Sodium (VOLTAREN) 1 % GEL Apply 2 g topically 4 (four) times daily. Rub into affected area of foot 2 to 4 times daily  . FLUoxetine (PROZAC) 20 MG capsule Take 1  capsule by mouth once daily  . ibuprofen (ADVIL) 800 MG tablet Take 1 tablet (800 mg total) by mouth every 8 (eight) hours as needed.  Marland Kitchen levonorgestrel (MIRENA) 20 MCG/24HR IUD by Intrauterine route.  . NP THYROID 120 MG tablet Take 1 tablet (120 mg total) by mouth 2 (two) times daily.  Marland Kitchen thyroid (ARMOUR) 90 MG tablet Take 90 mg by mouth daily.  . [DISCONTINUED] NP THYROID 120 MG tablet Take 1 tablet by mouth once daily       Depression screen [facility] 2/9 10/11/2016  Decreased Interest 0  Down, Depressed, Hopeless 0  PHQ - 2 Score 0     Objective:   Today's Vitals: BP 105/75 (BP Location: Left Arm, Patient Position: Sitting, Cuff Size: Normal)   Pulse 97   Temp 97.8 F (36.6 C) (Temporal)   Ht 5\' 4"  (1.626 m)   Wt 220 lb 12.8 oz (100.2 kg)   SpO2 97%   BMI 37.90 kg/m  Vitals with BMI 12/10/2019 10/28/2019 09/16/2019  Height 5\' 4"  5\' 4"  5\' 4"   Weight 220 lbs 13 oz 233 lbs 10 oz 240 lbs  BMI 37.88 40.08 41.18  Systolic 105 100 09/18/2019  Diastolic 75 70 74  Pulse 97 84 70     Physical Exam   She looks systemically well and remains obese but she has lost 13 pounds since the last time I saw her and in total she has lost 20 pounds since March of this year.  This is fantastic.    Assessment   1. PCOS (polycystic ovarian syndrome)   2. Class 2 obesity due to excess calories without serious comorbidity with body mass index (BMI) of 39.0 to 39.9 in adult   3. Malaise and fatigue   4. Vitamin D deficiency disease       Tests ordered No orders of the defined types were placed in this encounter.    Plan: 1. In view of her symptoms of thyroid deficiency, I think we can afford to increase and optimize further the NP thyroid so I have sent a new prescription for NP thyroid 120 mg twice a day.  If she gets side effects, she will let me know. 2. She will continue with her diet that she is on which seems to be working and also have encouraged her to walk briskly 30 minutes every day to  improve cardiovascular fitness and also start some strength training slowly. 3. I will see her in about 1 month for follow-up and we will repeat all blood work   Meds ordered this encounter  Medications  . NP THYROID 120 MG tablet    Sig: Take 1 tablet (120 mg total) by mouth 2 (two) times daily.    Dispense:  180 tablet    Refill:  0  Doree Albee, MD

## 2019-12-23 ENCOUNTER — Ambulatory Visit (INDEPENDENT_AMBULATORY_CARE_PROVIDER_SITE_OTHER): Admitting: Podiatry

## 2019-12-23 ENCOUNTER — Other Ambulatory Visit: Payer: Self-pay

## 2019-12-23 DIAGNOSIS — L910 Hypertrophic scar: Secondary | ICD-10-CM | POA: Diagnosis not present

## 2019-12-23 DIAGNOSIS — M7751 Other enthesopathy of right foot: Secondary | ICD-10-CM

## 2019-12-23 DIAGNOSIS — M779 Enthesopathy, unspecified: Secondary | ICD-10-CM

## 2019-12-29 NOTE — Progress Notes (Signed)
Subjective: Wendy Watkins is a 44 y.o. is seen today in office s/p right flatfoot reconstruction and left foot steroid injection on the hypertrophic scar preformed on 05/08/2019.  We will do a steroid injections for the scar on the right side which been helpful.  She presents today for another injection.  She has occasional discomfort.  She actually was able to wear shoes with quite a bit of a high wedge since I last saw her. Denies any fevers, chills, nausea, vomiting.  No calf pain, chest pain, shortness of breath.    Objective: General: No acute distress, AAOx3-frustrated with the right foot still causing discomfort. DP/PT pulses palpable 2/4, CRT < 3 sec to all digits.  Protective sensation intact. Motor function intact.  RIGHT foot: Incision is well coapted without any evidence of dehiscence and scar is formed.  The scar is hypertrophic although somewhat improved.  There is discomfort mostly on the actual scars.  No other areas of discomfort.  She still gets numbness of the fifth toe.  Flexor, extensor tendons appear to be intact. On the left foot no significant tenderness on exam.   No pain with calf compression, swelling, warmth, erythema.   Assessment and Plan:  Status post right flatfoot reconstruction,  hypertrophic scar  -Treatment options discussed including all alternatives, risks, and complications -Steroid injection performed under the scar.  Skin was cleaned alcohol and a mixture of 1 cc Kenalog 10, 0.5 cc of Marcaine plain, 0.5 cc of lidocaine plain was infiltrated subdermally under the area the hypertrophic scar to the lateral aspect of right foot.  I see no changes in the surgical center for her as well as possibly a PRP injection.  Vivi Barrack DPM

## 2020-01-15 ENCOUNTER — Ambulatory Visit (INDEPENDENT_AMBULATORY_CARE_PROVIDER_SITE_OTHER): Admitting: Internal Medicine

## 2020-01-28 ENCOUNTER — Ambulatory Visit: Admitting: Podiatry

## 2020-01-29 ENCOUNTER — Encounter: Payer: Self-pay | Admitting: Podiatry

## 2020-01-29 DIAGNOSIS — L910 Hypertrophic scar: Secondary | ICD-10-CM

## 2020-01-31 ENCOUNTER — Telehealth: Payer: Self-pay | Admitting: *Deleted

## 2020-01-31 NOTE — Telephone Encounter (Signed)
Called and left a message for the patient stating I was calling to see how the patient was doing after surgery with Dr Ardelle Anton and to call the office if any concerns or questions. Wendy Watkins

## 2020-02-12 ENCOUNTER — Other Ambulatory Visit (INDEPENDENT_AMBULATORY_CARE_PROVIDER_SITE_OTHER): Payer: Self-pay | Admitting: Internal Medicine

## 2020-02-13 ENCOUNTER — Other Ambulatory Visit (INDEPENDENT_AMBULATORY_CARE_PROVIDER_SITE_OTHER): Payer: Self-pay | Admitting: Internal Medicine

## 2020-02-13 MED ORDER — FLUOXETINE HCL 20 MG PO CAPS: 20.0000 mg | ORAL_CAPSULE | Freq: Every day | ORAL | 3 refills | Status: DC

## 2020-02-20 ENCOUNTER — Ambulatory Visit: Admitting: Podiatry

## 2020-03-17 ENCOUNTER — Other Ambulatory Visit

## 2020-03-17 ENCOUNTER — Other Ambulatory Visit: Payer: Self-pay

## 2020-03-17 DIAGNOSIS — Z20822 Contact with and (suspected) exposure to covid-19: Secondary | ICD-10-CM

## 2020-03-19 LAB — SPECIMEN STATUS REPORT

## 2020-03-19 LAB — SARS-COV-2, NAA 2 DAY TAT

## 2020-03-19 LAB — NOVEL CORONAVIRUS, NAA: SARS-CoV-2, NAA: NOT DETECTED

## 2020-03-25 ENCOUNTER — Other Ambulatory Visit (INDEPENDENT_AMBULATORY_CARE_PROVIDER_SITE_OTHER): Payer: Self-pay | Admitting: Internal Medicine

## 2020-03-30 ENCOUNTER — Encounter (INDEPENDENT_AMBULATORY_CARE_PROVIDER_SITE_OTHER): Payer: Self-pay | Admitting: Internal Medicine

## 2020-03-30 ENCOUNTER — Other Ambulatory Visit (INDEPENDENT_AMBULATORY_CARE_PROVIDER_SITE_OTHER): Payer: Self-pay

## 2020-03-30 ENCOUNTER — Ambulatory Visit: Admitting: Podiatry

## 2020-03-30 NOTE — Telephone Encounter (Signed)
She has appt set for 11/21 top see you.

## 2020-03-31 ENCOUNTER — Ambulatory Visit (INDEPENDENT_AMBULATORY_CARE_PROVIDER_SITE_OTHER): Admitting: Podiatry

## 2020-03-31 ENCOUNTER — Ambulatory Visit (INDEPENDENT_AMBULATORY_CARE_PROVIDER_SITE_OTHER)

## 2020-03-31 ENCOUNTER — Other Ambulatory Visit: Payer: Self-pay

## 2020-03-31 DIAGNOSIS — M778 Other enthesopathies, not elsewhere classified: Secondary | ICD-10-CM | POA: Diagnosis not present

## 2020-03-31 DIAGNOSIS — M779 Enthesopathy, unspecified: Secondary | ICD-10-CM

## 2020-03-31 DIAGNOSIS — L910 Hypertrophic scar: Secondary | ICD-10-CM

## 2020-04-02 NOTE — Progress Notes (Signed)
Subjective: Wendy Watkins is a 44 y.o. is seen today for follow-up evaluation after undergoing steroid injection to hypertrophic scar in the right foot.  She states the injection was helpful but the scar is still somewhat thickened.  She has a new concern of pain to her left foot she wanted with metatarsal base.  She denies recent injury or trauma this pain is intermittent.  No recent injury or falls.  She states that she is try to wear stiletto heels which aggravate her symptoms. Denies any fevers, chills, nausea, vomiting.  No calf pain, chest pain, shortness of breath.    Objective: General: No acute distress, AAOx3-frustrated with the right foot still causing discomfort. DP/PT pulses palpable 2/4, CRT < 3 sec to all digits.  Protective sensation intact. Motor function intact.  RIGHT foot: Incision is well coapted without any evidence of dehiscence and scar is formed.  Skin is still somewhat hypertrophic on the lateral aspect but overall improved.  No significant pain in the right foot otherwise. LEFT foot: Mild tenderness on the fifth metatarsal base.  Minimal edema.  Peroneal tendon appears to be intact.  No other areas of discomfort identified. No pain with calf compression, swelling, warmth, erythema.   Assessment and Plan:  Status post right flatfoot reconstruction,  hypertrophic scar  -Treatment options discussed including all alternatives, risks, and complications -Steroid injection performed under the scar.  Skin was cleaned alcohol and a mixture of 1 cc Kenalog 10, 0.5 cc of Marcaine plain, 0.5 cc of lidocaine plain was infiltrated subdermally under the area the hypertrophic scar to the lateral aspect of right foot on the area thicker scar.   -Regards to left foot x-rays obtained reviewed.  Previous callus appears incorporated.  Hardware intact of the calcaneus.  No evidence of fracture identified.  Discussed changing shoes and not worse to let us wear shoes with higher heels.  She can  use Voltaren gel as needed.  If symptoms continue to do a steroid injection if needed.  Vivi Barrack DPM

## 2020-04-13 ENCOUNTER — Other Ambulatory Visit (INDEPENDENT_AMBULATORY_CARE_PROVIDER_SITE_OTHER): Payer: Self-pay | Admitting: Internal Medicine

## 2020-04-13 MED ORDER — NP THYROID 120 MG PO TABS: 120.0000 mg | ORAL_TABLET | Freq: Two times a day (BID) | ORAL | 3 refills | Status: DC

## 2020-04-20 IMAGING — RF LEFT FOOT - 2 VIEW
1 series · 4 of 4 positions shown · non-contrast
Comparison: 09/17/2018

CLINICAL DATA: Foot pain

EXAM:
LEFT FOOT - 2 VIEW

[Series 1: run · 4 of 4 slices shown]
[im 1/4]
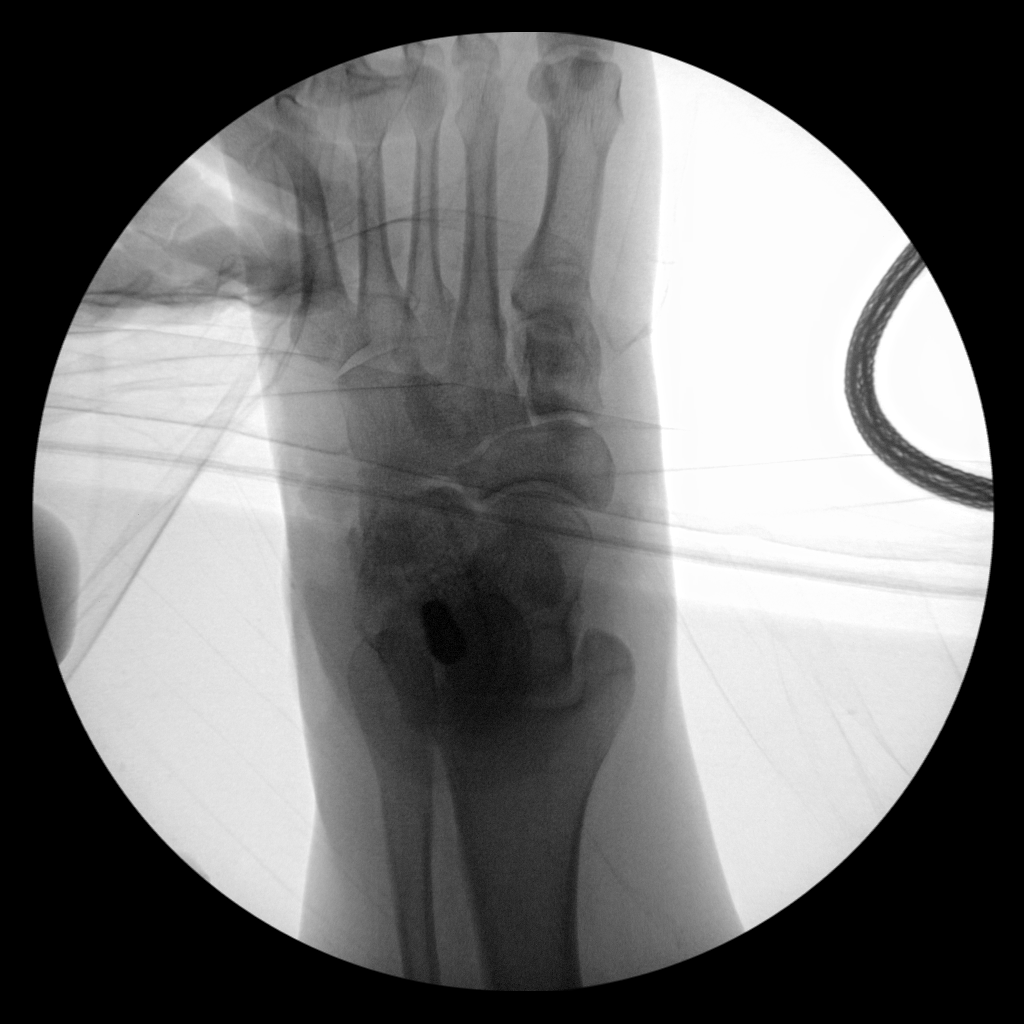
[im 2/4]
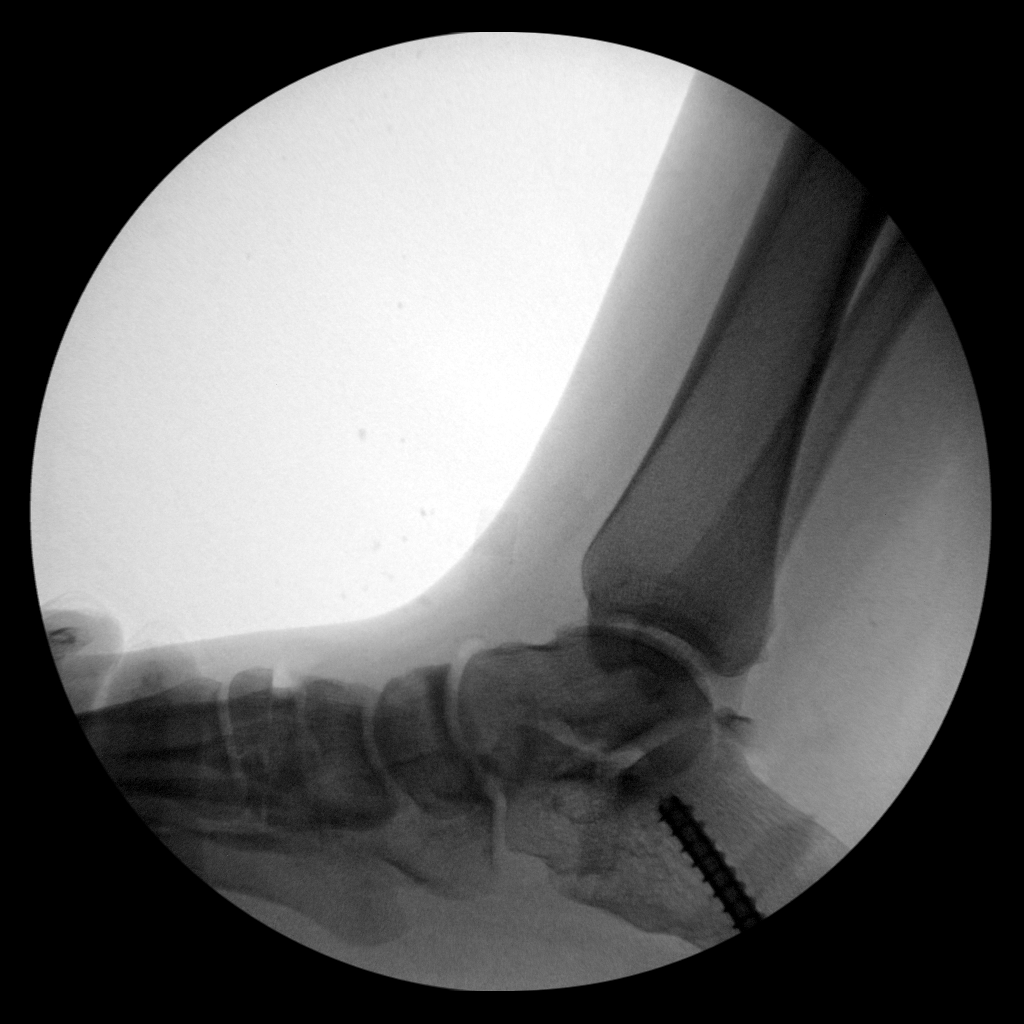
[im 3/4]
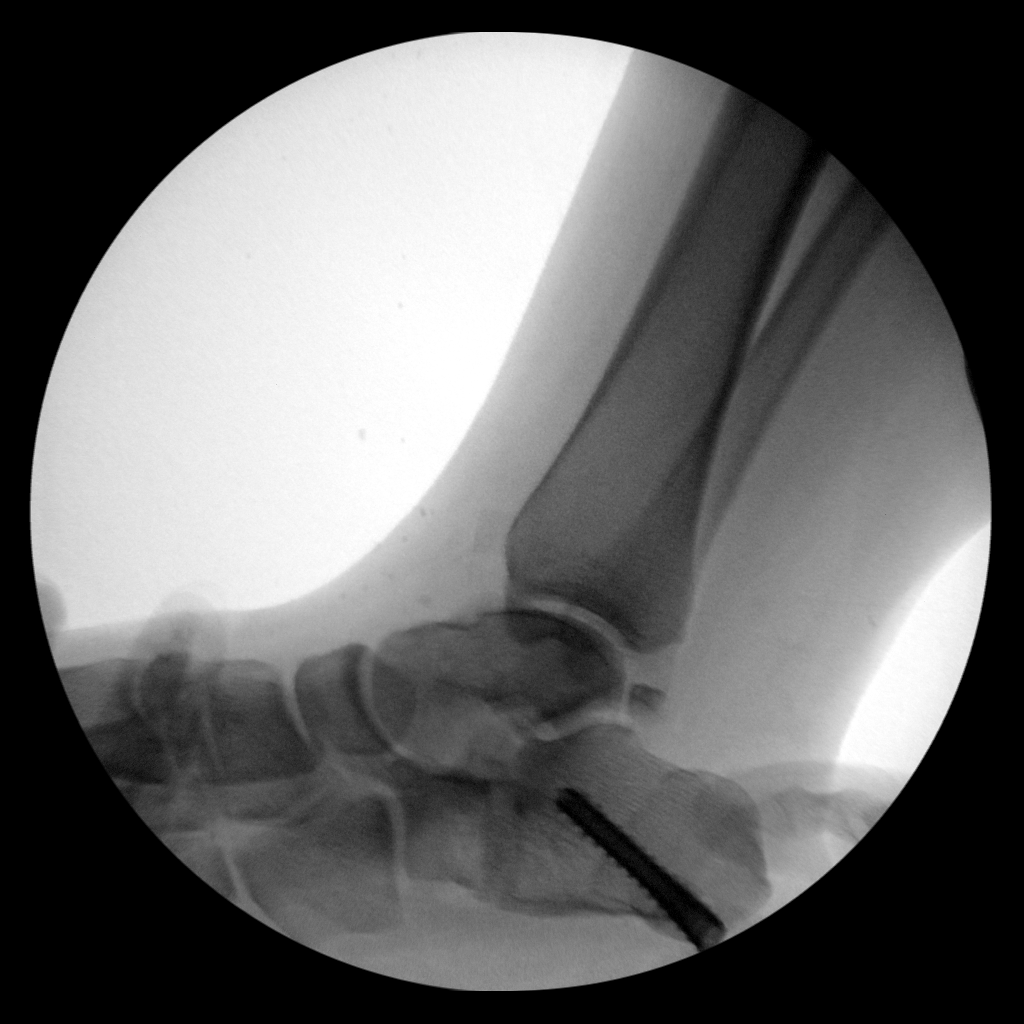
[im 4/4]
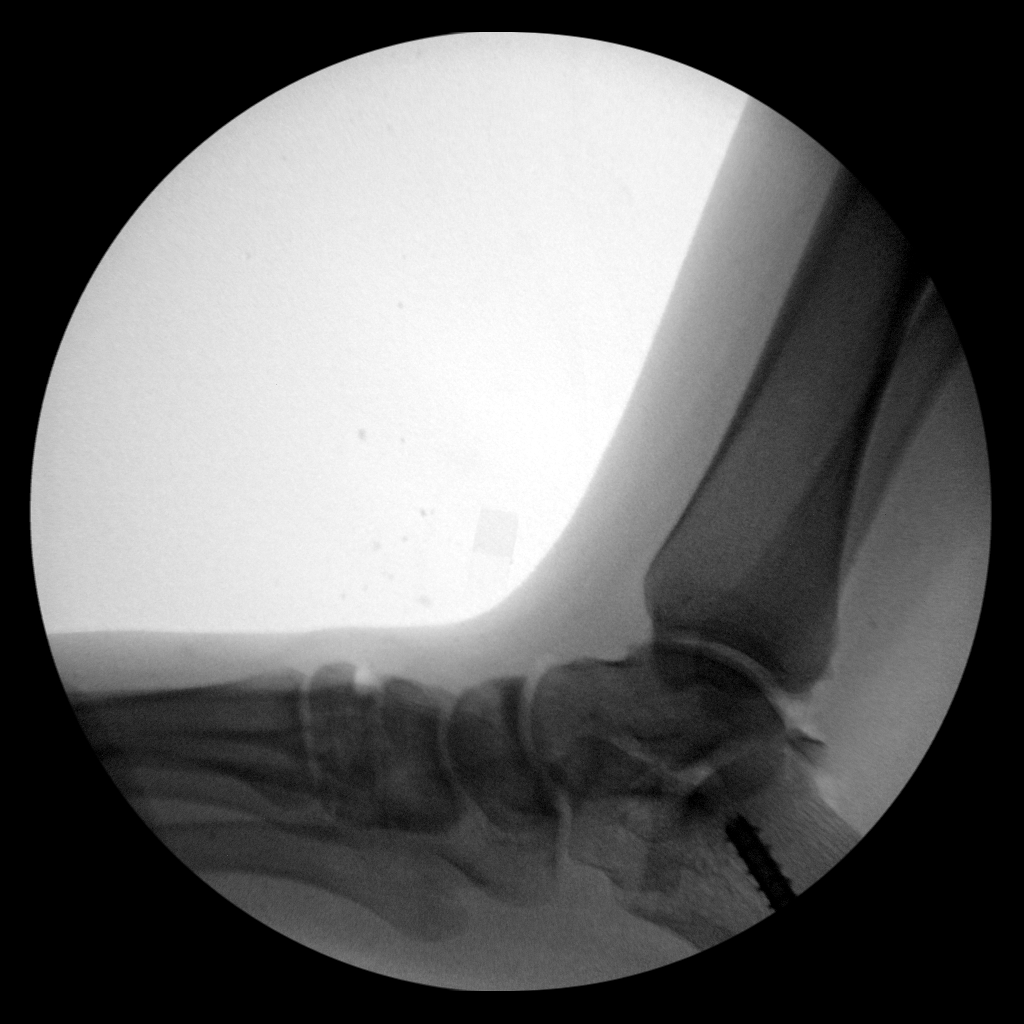

[4 of 4 positions shown; findings below may reference images not displayed]

FINDINGS: Four intraoperative views of the left foot were obtained. A
cannulated screw is noted coursing through the calcaneus. The total
fluoroscopy time was 284 seconds.
IMPRESSION: Fluoroscopy provided for surgical intervention of the left foot.

## 2020-05-05 ENCOUNTER — Ambulatory Visit (INDEPENDENT_AMBULATORY_CARE_PROVIDER_SITE_OTHER): Admitting: Internal Medicine

## 2020-05-12 ENCOUNTER — Ambulatory Visit: Admitting: Podiatry

## 2020-07-10 ENCOUNTER — Other Ambulatory Visit: Payer: Self-pay

## 2020-07-10 ENCOUNTER — Other Ambulatory Visit

## 2020-07-10 DIAGNOSIS — Z20822 Contact with and (suspected) exposure to covid-19: Secondary | ICD-10-CM

## 2020-07-14 LAB — NOVEL CORONAVIRUS, NAA: SARS-CoV-2, NAA: DETECTED — AB

## 2020-09-21 IMAGING — RF DG FOOT 2V*R*
1 series · 1 of 1 positions shown · non-contrast
Comparison: Radiographs 01/29/2019

CLINICAL DATA: Right foot surgery.

EXAM:
RIGHT FOOT - 2 VIEW; DG C-ARM 1-60 MIN-NO REPORT

[Series 1: run · 1 of 1 slices shown]
[im 1/1]
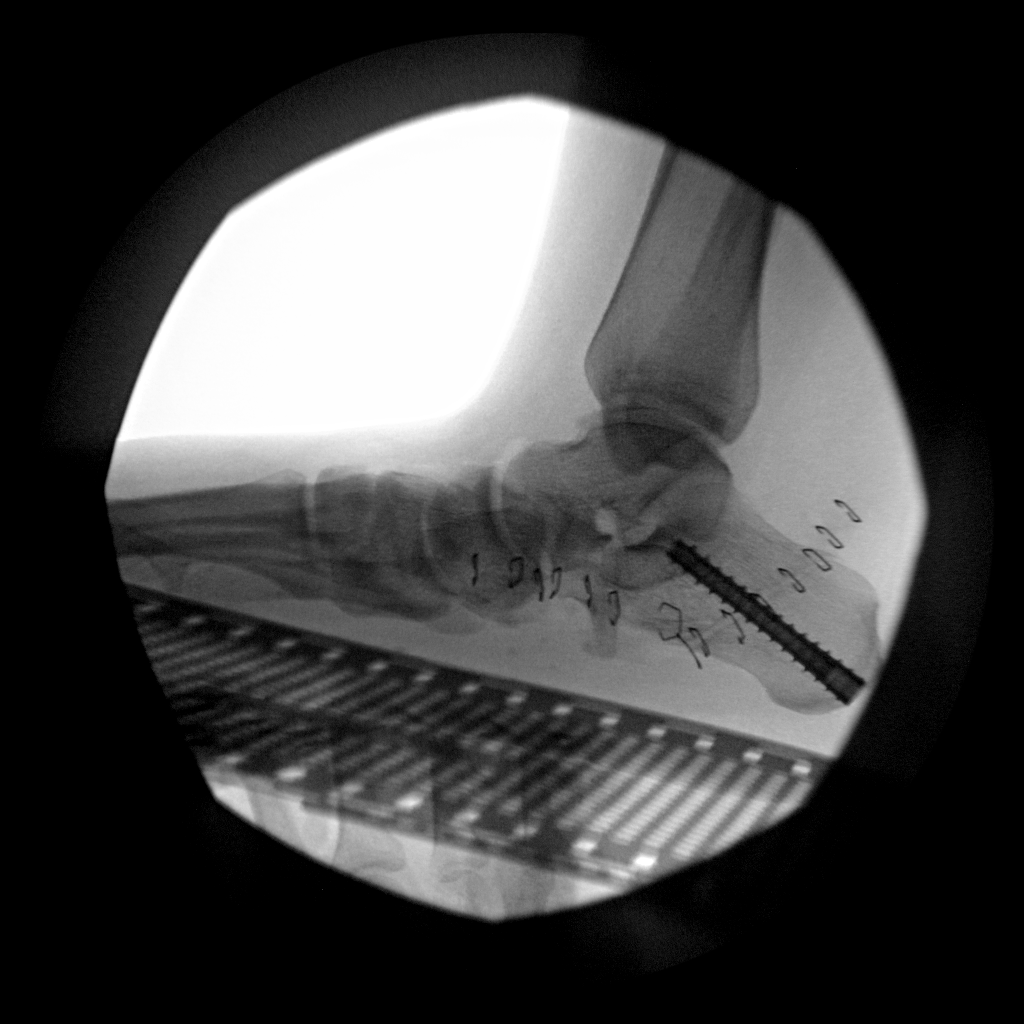

[1 of 1 positions shown; findings below may reference images not displayed]

FINDINGS: Intraoperative lateral spot film of the right foot demonstrates a
stable calcaneal osteotomy and fixating screw. Skin staples are
noted.
IMPRESSION: Stable calcaneal screw.

## 2020-09-21 IMAGING — RF DG C-ARM 1-60 MIN-NO REPORT
1 series · 1 of 1 positions shown · non-contrast
Comparison: Radiographs 01/29/2019

CLINICAL DATA: Right foot surgery.

EXAM:
RIGHT FOOT - 2 VIEW; DG C-ARM 1-60 MIN-NO REPORT

[Series 1: run · 1 of 1 slices shown]
[im 1/1]
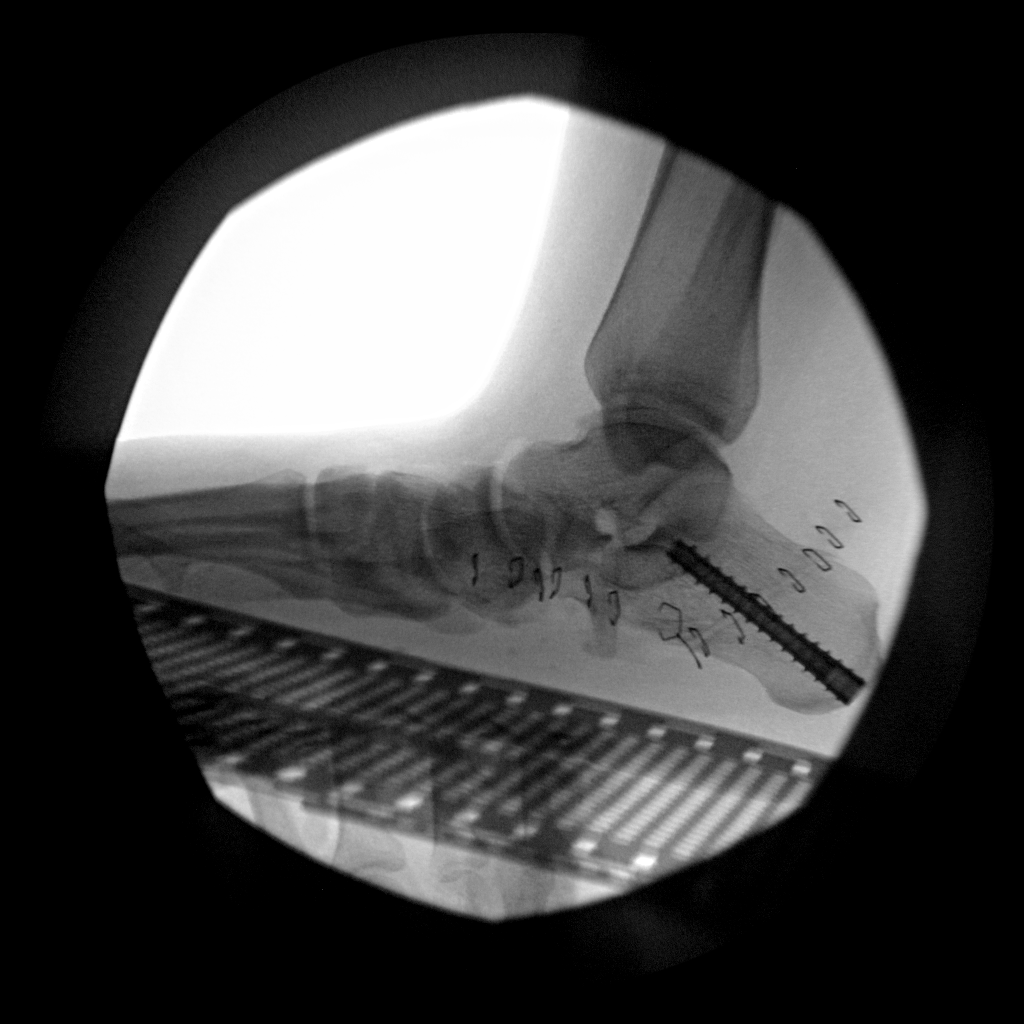

[1 of 1 positions shown; findings below may reference images not displayed]

FINDINGS: Intraoperative lateral spot film of the right foot demonstrates a
stable calcaneal osteotomy and fixating screw. Skin staples are
noted.
IMPRESSION: Stable calcaneal screw.

## 2020-10-18 IMAGING — MR MR MRV HEAD W/O CM
4 of 5 series · 35 of 48 positions shown · non-contrast
Comparison: None.

CLINICAL DATA: Asymmetric pulsatile tinnitus.  Side not specified.

EXAM:
MR VENOGRAM OF THE HEAD WITHOUT CONTRAST
TECHNIQUE: Angiographic images of the intracranial venous structures were
obtained using MRV technique without intravenous contrast.

[Series 3: TOF · coronal · 3.0mm · 0.72mm/px · 7 of 100 slices shown]
[im 1/100]
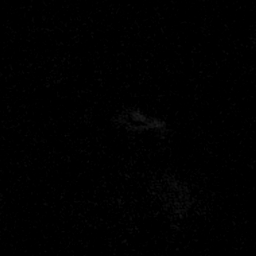
[im 17/100]
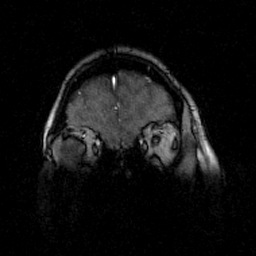
[im 34/100]
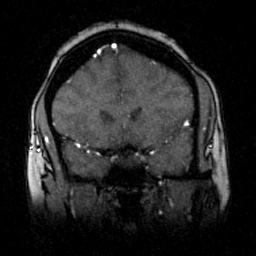
[im 50/100]
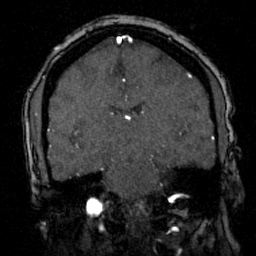
[im 67/100]
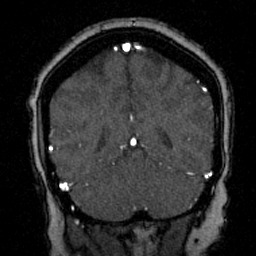
[im 83/100]
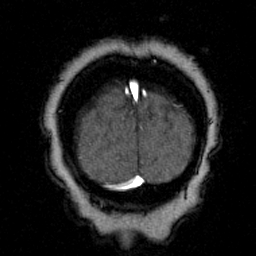
[im 100/100]
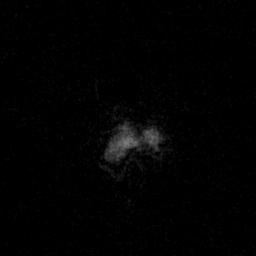

[Series 8: tof_3d_multi-slab · coronal · 0.5mm · 0.36mm/px · 18 of 376 slices shown]
[im 1/376]
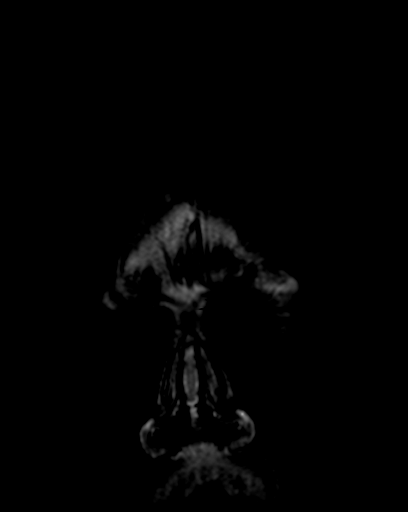
[im 13/376]
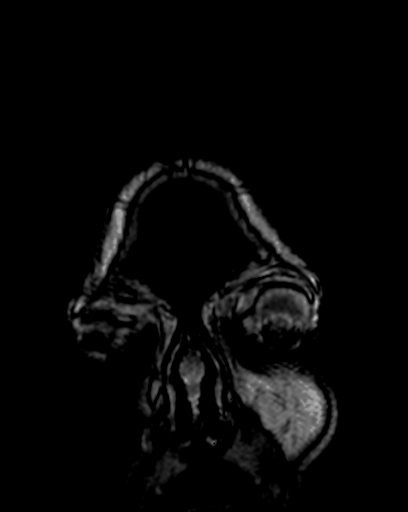
[im 26/376]
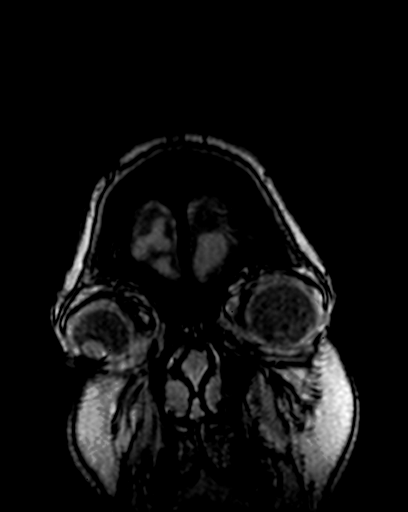
[im 39/376]
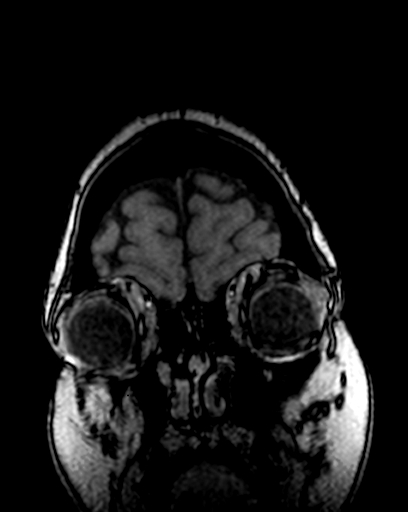
[im 52/376]
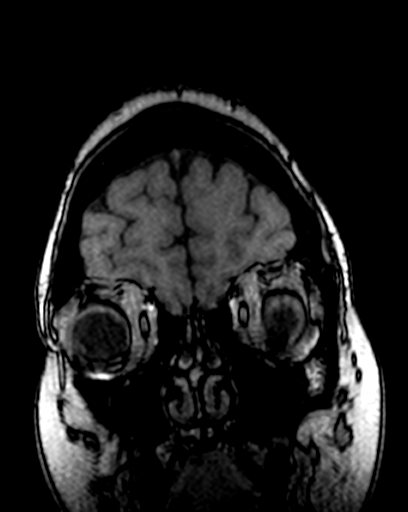
[im 65/376]
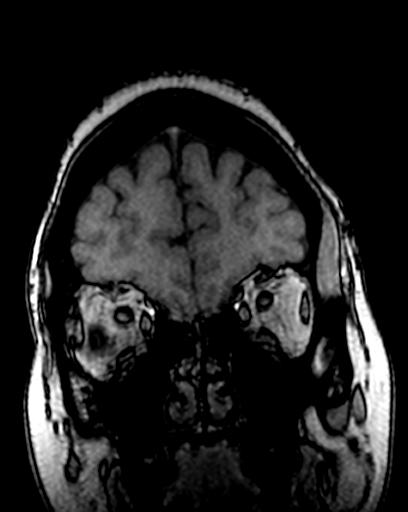
[im 78/376]
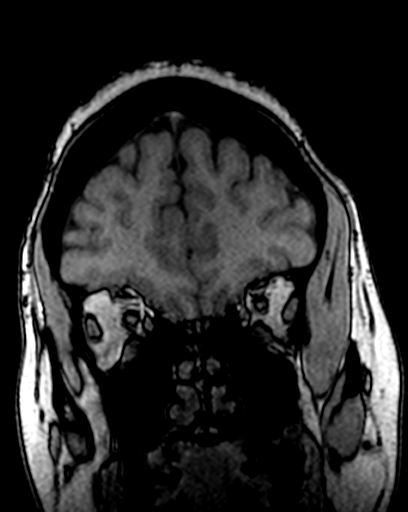
[im 91/376]
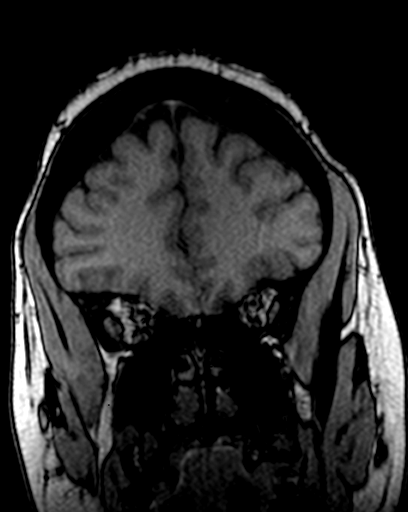
[im 104/376]
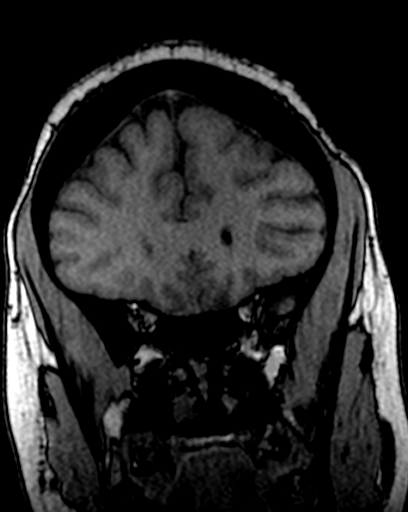
[im 117/376]
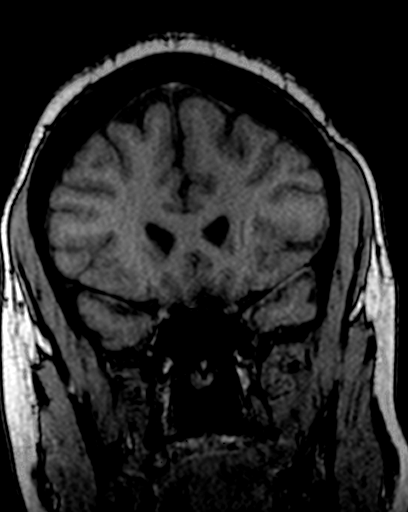
[im 130/376]
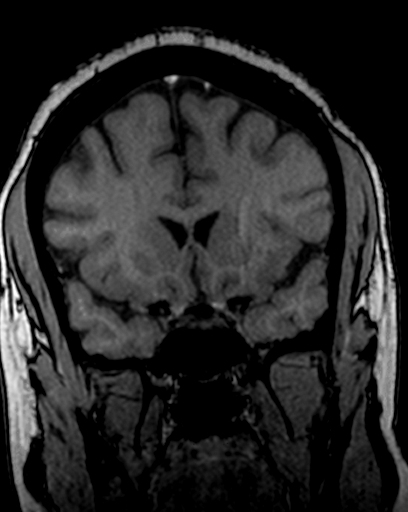
[im 169/376]
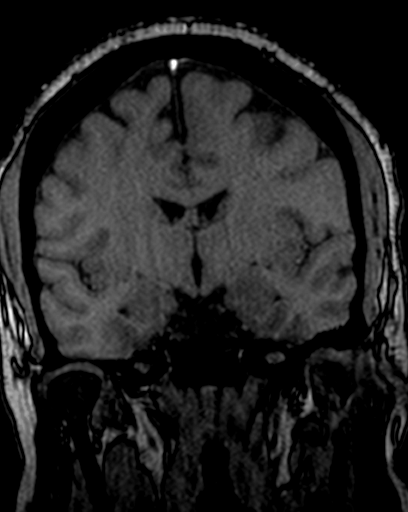
[im 194/376]
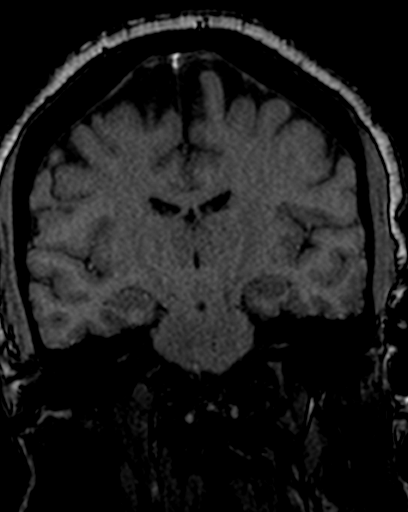
[im 207/376]
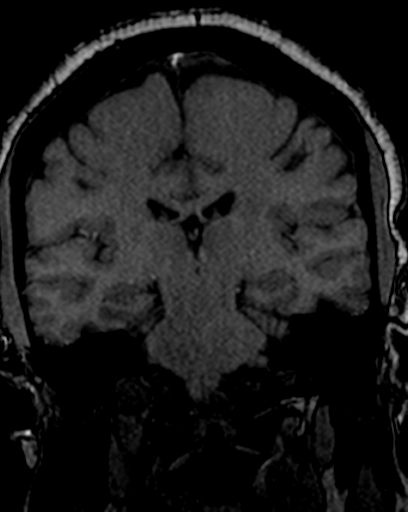
[im 259/376]
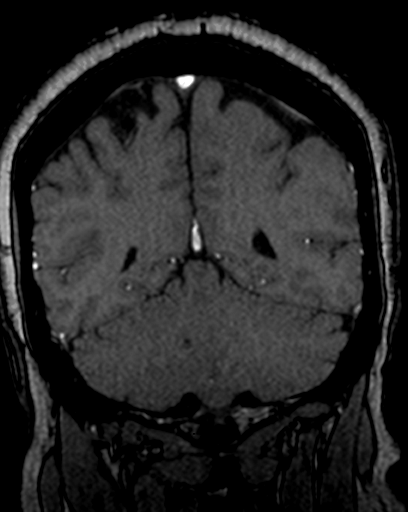
[im 311/376]
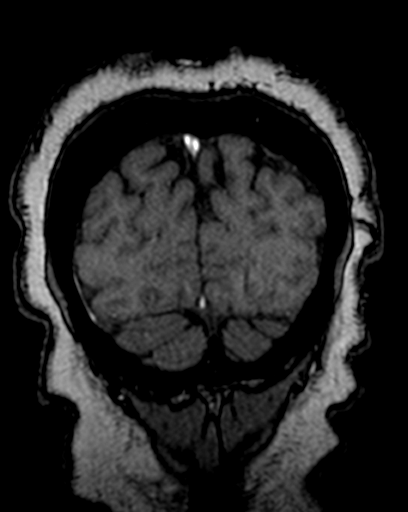
[im 324/376]
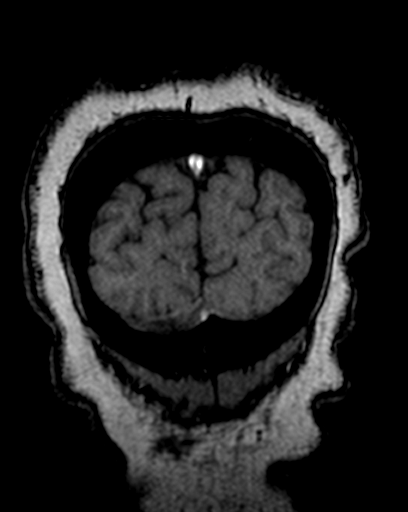
[im 363/376]
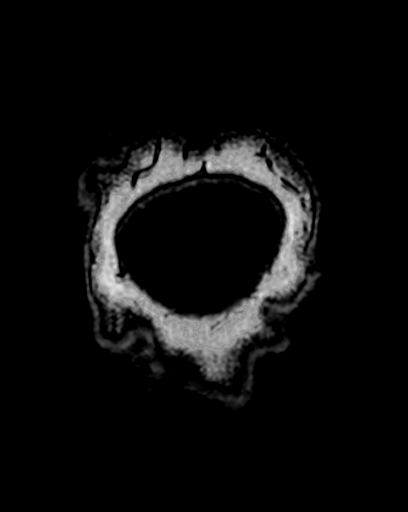

[Series 102: mpr coronal · coronal · 3.0mm · 0.98mm/px · 5 of 61 slices shown]
[im 1/61]
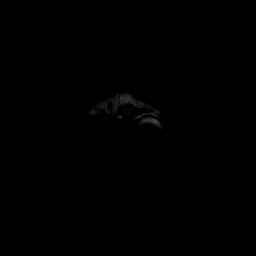
[im 16/61]
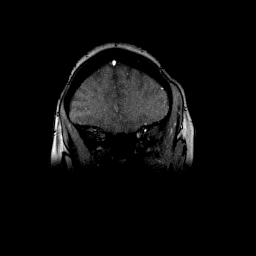
[im 31/61]
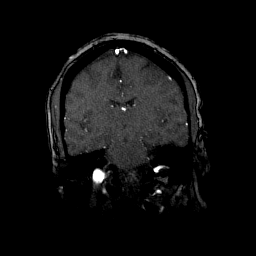
[im 46/61]
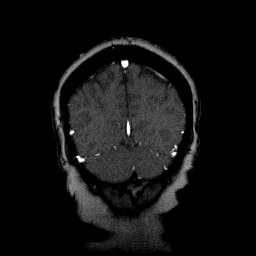
[im 61/61]
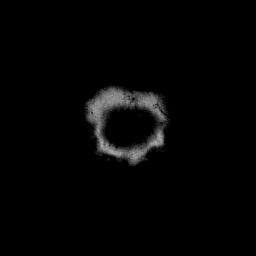

[Series 103: <mpr range> · axial · 3.0mm · 0.98mm/px · z∈[-131,+39]mm · 5 of 59 slices shown]
[im 1/59]
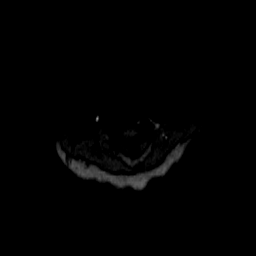
[im 15/59]
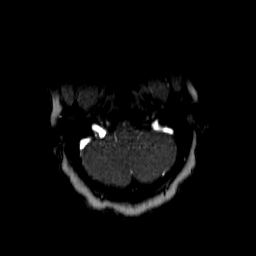
[im 30/59]
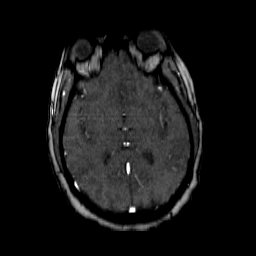
[im 44/59]
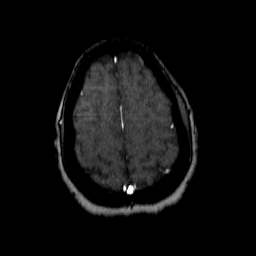
[im 59/59]
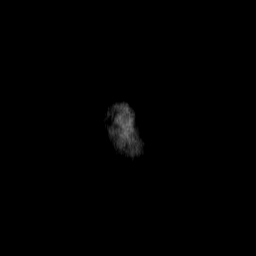

[35 of 48 positions shown; findings below may reference images not displayed]

FINDINGS: Normal examination. Superior sagittal sinus is normal without
stenosis. Both transverse sinuses are patent without stenosis, the
right being larger than the left. Normal defects related to
arachnoid granulations. Sigmoid sinuses and jugular veins are patent
again larger on right than the left.
IMPRESSION: Normal intracranial MR venography. Abnormality seen to explain
pulsatile tinnitus.

## 2020-12-09 ENCOUNTER — Ambulatory Visit (INDEPENDENT_AMBULATORY_CARE_PROVIDER_SITE_OTHER): Admitting: Nurse Practitioner

## 2020-12-09 ENCOUNTER — Other Ambulatory Visit: Payer: Self-pay

## 2020-12-09 ENCOUNTER — Encounter (INDEPENDENT_AMBULATORY_CARE_PROVIDER_SITE_OTHER): Payer: Self-pay | Admitting: Nurse Practitioner

## 2020-12-09 VITALS — BP 110/68 | HR 84 | Temp 96.4°F | Ht 61.0 in | Wt 243.0 lb

## 2020-12-09 DIAGNOSIS — N3090 Cystitis, unspecified without hematuria: Secondary | ICD-10-CM

## 2020-12-09 LAB — POCT URINE PREGNANCY: Preg Test, Ur: NEGATIVE

## 2020-12-09 MED ORDER — NITROFURANTOIN MONOHYD MACRO 100 MG PO CAPS: 100.0000 mg | ORAL_CAPSULE | Freq: Two times a day (BID) | ORAL | 0 refills | Status: DC

## 2020-12-09 NOTE — Progress Notes (Signed)
Subjective:  Patient ID: Wendy Watkins, female    DOB: 1975-12-26  Age: 45 y.o. MRN: [id number]  CC:  Chief Complaint  Patient presents with  . Urinary Tract Infection      HPI  This patient arrives today for the above.  She tells me she has been having frequency and dysuria since last week.  She started taking Azo over-the-counter the last 2 days and her symptoms have improved.  She has had some mild nausea but denies any vomiting, diarrhea, fever, or chills.  She has noticed hematuria.  She tells me last time she had a UTI that felt like that she ended up hospitalized this was back in 1996.  Past Medical History:  Diagnosis Date  . Anxiety   . Arthritis    hip  . Chiari I malformation ([facility])   . Eczema   . GERD (gastroesophageal reflux disease)   . Heavy menstrual period   . History of iron deficiency anemia   . Hyperlipidemia   . Hypothyroidism   . Migraines   . Obesity   . PCOS (polycystic ovarian syndrome)   . PMS (premenstrual syndrome) 10/11/2016  . Urge incontinence   . Vitamin D deficiency   . Wears contact lenses   . Wears glasses   . Wears glasses       Family History  Problem Relation Age of Onset  . Hyperlipidemia Mother   . Hypertension Mother   . Arthritis Father   . Cancer Father        lymphoma  . Heart disease Maternal Grandmother        CHF  . Hyperlipidemia Maternal Grandmother   . Hypertension Maternal Grandmother   . Diabetes Maternal Grandmother   . Cancer Paternal Grandmother        ovarian  . Kidney disease Paternal Grandfather   . 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 Smoking status: Never Smoker  . Smokeless tobacco: Never Used  Substance Use Topics  . Alcohol use: Yes     Comment: socially wine     Current Meds  Medication Sig  . Cholecalciferol (VITAMIN D3 PO) Take 10,000 Units by mouth daily.   . clobetasol ointment (TEMOVATE) 0.05 % APPLY OINTMENT TOPICALLY TO SKIN TWICE DAILY FOR 14 DAYS  . levonorgestrel (MIRENA) 20 MCG/24HR IUD by Intrauterine route.  . nitrofurantoin, macrocrystal-monohydrate, (MACROBID) 100 MG capsule Take 1 capsule (100 mg total) by mouth 2 (two) times daily.  . NP THYROID 120 MG tablet Take 1 tablet (120 mg total) by mouth 2 (two) times daily.    ROS:  Review of Systems  Constitutional: Negative for chills and fever.  Gastrointestinal: Positive for nausea. Negative for vomiting.  Genitourinary: Positive for dysuria, flank pain, frequency and hematuria.     Objective:   Today's Vitals: BP 110/68   Pulse 84   Temp (!) 96.4 F (35.8 C)   Ht 5\' 1"  (1.549 m)   Wt 243 lb (110.2 kg)   SpO2 93%   BMI 45.91 kg/m  Vitals with BMI 12/09/2020 12/10/2019 10/28/2019  Height 5\' 1"  5\' 4"  5\' 4"   Weight 243 lbs 220 lbs 13 oz  233 lbs 10 oz  BMI 45.94 37.88 40.08  Systolic 110 105 329  Diastolic 68 75 70  Pulse 84 97 84     Physical Exam Vitals reviewed.  Constitutional:      General: She is not in acute distress.    Appearance: Normal appearance.  HENT:     Head: Normocephalic and atraumatic.  Neck:     Vascular: No carotid bruit.  Cardiovascular:     Rate and Rhythm: Normal rate and regular rhythm.     Pulses: Normal pulses.     Heart sounds: Normal heart sounds.  Pulmonary:     Effort: Pulmonary effort is normal.     Breath sounds: Normal breath sounds.  Abdominal:     Tenderness: There is no right CVA tenderness or left CVA tenderness.  Skin:    General: Skin is warm and dry.  Neurological:     General: No focal deficit present.     Mental Status: She is alert and oriented to person, place, and time.  Psychiatric:        Mood and Affect: Mood normal.        Behavior: Behavior normal.        Judgment: Judgment  normal.          Assessment and Plan   1. Cystitis      Plan: 1.  Vital signs stable, we will send the urine off for urinalysis with reflex to culture.  We will rule out pregnancy today.  We will also order Macrobid and may change this based on culture results.  She was encouraged to hydrate thoroughly throughout the day with water intake Azo as needed.   Tests ordered Orders Placed This Encounter  Procedures  . Urinalysis with Culture Reflex  . POCT urine pregnancy      Meds ordered this encounter  Medications  . nitrofurantoin, macrocrystal-monohydrate, (MACROBID) 100 MG capsule    Sig: Take 1 capsule (100 mg total) by mouth 2 (two) times daily.    Dispense:  14 capsule    Refill:  0    Order Specific Question:   Supervising Provider    Answer:   Wilson Singer [[id number]]    Patient to follow-up in 3 months or sooner as needed.  Elenore Paddy, NP

## 2020-12-09 NOTE — Patient Instructions (Signed)
Nitrofurantoin tablets or capsules What is this medicine? NITROFURANTOIN (nye troe fyoor AN toyn) is an antibiotic. It is used to treat urinary tract infections. This medicine may be used for other purposes; ask your health care provider or pharmacist if you have questions. COMMON BRAND NAME(S): Macrobid, Macrodantin, Urotoin What should I tell my health care provider before I take this medicine? They need to know if you have any of these conditions:  anemia  diabetes  glucose-6-phosphate dehydrogenase deficiency  kidney disease  liver disease  lung disease  other chronic illness  an unusual or allergic reaction to nitrofurantoin, other antibiotics, other medicines, foods, dyes or preservatives  pregnant or trying to get pregnant  breast-feeding How should I use this medicine? Take this medicine by mouth with a glass of water. Follow the directions on the prescription label. Take this medicine with food or milk. Take your doses at regular intervals. Do not take your medicine more often than directed. Do not stop taking except on your doctor's advice. Talk to your pediatrician regarding the use of this medicine in children. While this drug may be prescribed for selected conditions, precautions do apply. Overdosage: If you think you have taken too much of this medicine contact a poison control center or emergency room at once. NOTE: This medicine is only for you. Do not share this medicine with others. What if I miss a dose? If you miss a dose, take it as soon as you can. If it is almost time for your next dose, take only that dose. Do not take double or extra doses. What may interact with this medicine?  antacids containing magnesium trisilicate  probenecid  quinolone antibiotics like ciprofloxacin, lomefloxacin, norfloxacin and ofloxacin  sulfinpyrazone This list may not describe all possible interactions. Give your health care provider a list of all the medicines, herbs,  non-prescription drugs, or dietary supplements you use. Also tell them if you smoke, drink alcohol, or use illegal drugs. Some items may interact with your medicine. What should I watch for while using this medicine? Tell your doctor or health care professional if your symptoms do not improve or if you get new symptoms. Drink several glasses of water a day. If you are taking this medicine for a long time, visit your doctor for regular checks on your progress. If you are diabetic, you may get a false positive result for sugar in your urine with certain brands of urine tests. Check with your doctor. What side effects may I notice from receiving this medicine? Side effects that you should report to your doctor or health care professional as soon as possible:  allergic reactions like skin rash or hives, swelling of the face, lips, or tongue  chest pain  cough  difficulty breathing  dizziness, drowsiness  fever or infection  joint aches or pains  pale or blue-tinted skin  redness, blistering, peeling or loosening of the skin, including inside the mouth  tingling, burning, pain, or numbness in hands or feet  unusual bleeding or bruising  unusually weak or tired  yellowing of eyes or skin Side effects that usually do not require medical attention (report to your doctor or health care professional if they continue or are bothersome):  dark urine  diarrhea  headache  loss of appetite  nausea or vomiting  temporary hair loss This list may not describe all possible side effects. Call your doctor for medical advice about side effects. You may report side effects to FDA at 1-800-FDA-1088. Where should  I keep my medicine? Keep out of the reach of children. Store at room temperature between 15 and 30 degrees C (59 and 86 degrees F). Protect from light. Throw away any unused medicine after the expiration date. NOTE: This sheet is a summary. It may not cover all possible information.  If you have questions about this medicine, talk to your doctor, pharmacist, or health care provider.  2021 Elsevier/Gold Standard (2008-01-09 15:56:47)

## 2020-12-12 LAB — URINALYSIS W MICROSCOPIC + REFLEX CULTURE
Bilirubin Urine: NEGATIVE
Glucose, UA: NEGATIVE
Ketones, ur: NEGATIVE
Nitrites, Initial: POSITIVE — AB
Specific Gravity, Urine: 1.034 (ref 1.001–1.035)
WBC, UA: >=60 /HPF — AB (ref 0–5)
Yeast: NONE SEEN /HPF
pH: 7.5 (ref 5.0–8.0)

## 2020-12-12 LAB — URINE CULTURE

## 2020-12-12 LAB — CULTURE INDICATED

## 2020-12-14 ENCOUNTER — Encounter (INDEPENDENT_AMBULATORY_CARE_PROVIDER_SITE_OTHER): Payer: Self-pay | Admitting: Nurse Practitioner

## 2020-12-22 ENCOUNTER — Other Ambulatory Visit: Payer: Self-pay

## 2020-12-22 ENCOUNTER — Telehealth: Payer: Self-pay | Admitting: Emergency Medicine

## 2020-12-22 ENCOUNTER — Ambulatory Visit
Admission: EM | Admit: 2020-12-22 | Discharge: 2020-12-22 | Disposition: A | Discharge status: Home / Self Care | Attending: Family Medicine | Admitting: Family Medicine

## 2020-12-22 ENCOUNTER — Encounter: Payer: Self-pay | Admitting: Emergency Medicine

## 2020-12-22 DIAGNOSIS — H10533 Contact blepharoconjunctivitis, bilateral: Secondary | ICD-10-CM | POA: Diagnosis not present

## 2020-12-22 MED ORDER — AMOXICILLIN-POT CLAVULANATE 875-125 MG PO TABS: 1.0000 | ORAL_TABLET | Freq: Two times a day (BID) | ORAL | 0 refills | Status: DC

## 2020-12-22 MED ORDER — PREDNISONE 10 MG PO TABS: 10.0000 mg | ORAL_TABLET | Freq: Every day | ORAL | 0 refills | Status: DC

## 2020-12-22 MED ORDER — OLOPATADINE HCL 0.1 % OP SOLN: 1.0000 [drp] | Freq: Two times a day (BID) | OPHTHALMIC | 12 refills | Status: DC | PRN

## 2020-12-22 MED ORDER — PREDNISONE 10 MG PO TABS: 10.0000 mg | ORAL_TABLET | Freq: Every day | ORAL | 0 refills | Status: AC

## 2020-12-22 NOTE — ED Triage Notes (Signed)
Pt here for right upper eye lid swelling; pt sts with some green discharge; pt sts had lashes put on 4 days ago and noticed sx 2 days ago

## 2020-12-22 NOTE — ED Provider Notes (Signed)
[facility]    CSN: [id number] Arrival date & time: 12/22/20  1717      History   Chief Complaint Chief Complaint  Patient presents with   Facial Swelling    Right eye     HPI Wendy Watkins is a 45 y.o. female.   HPI Patient presents today with two days of gradually worsening right eye lid swelling along with copious drainage.  Patient recently had eyelashes placed on her right eye and reports over the last 2 days subsequently developing symptoms of an eye infection.  She has diffuse swelling of upper eyelid.  The eyelashes remain in place.  Denies any prior reactions to having eyelash implants.  She also has exudative drainage and endorses some blurriness due to the drainage although denies any loss of vision.  Past Medical History:  Diagnosis Date   Anxiety    Arthritis    hip   Chiari I malformation (HCC)    Eczema    GERD (gastroesophageal reflux disease)    Heavy menstrual period    History of iron deficiency anemia    Hyperlipidemia    Hypothyroidism    Migraines    Obesity    PCOS (polycystic ovarian syndrome)    PMS (premenstrual syndrome) 10/11/2016   Urge incontinence    Vitamin D deficiency    Wears contact lenses    Wears glasses    Wears glasses     Patient Active Problem List   Diagnosis Date Noted   PMS (premenstrual syndrome) 10/11/2016   Food allergy 10/11/2016   Eczema 10/11/2016   Morbid obesity (HCC) 10/31/2007   MIGRAINE HEADACHE 10/31/2007    Past Surgical History:  Procedure Laterality Date   CALCANEAL OSTEOTOMY Left 12/05/2018   Procedure: EVANCALCANEAL OSTEOTOMY;  Surgeon: Vivi Barrack, DPM;  Location: [facility] [place];  Service: Podiatry;  Laterality: Left;   CALCANEAL OSTEOTOMY Right 05/08/2019   Procedure: EVAN CALCANEAL OSTEOTOMY;  Surgeon: Vivi Barrack, DPM;  Location: [facility] [place];  Service: Podiatry;  Laterality: Right;   FLAT FOOT RECONSTRUCTION-TAL GASTROC RECESSION Right  05/08/2019   Procedure: FLAT FOOT RECONSTRUCTION-TAL GASTROC RECESSION;  Surgeon: Vivi Barrack, DPM;  Location: [facility] [place];  Service: Podiatry;  Laterality: Right;   GASTROC RECESSION EXTREMITY Left 12/05/2018   Procedure: GASTROC RECESSION EXTREMITY;  Surgeon: Vivi Barrack, DPM;  Location: [facility] [place];  Service: Podiatry;  Laterality: Left;   OSTECTOMY Left 12/05/2018   Procedure: COTTON OSTEOTOMY, MEDIAL CALCANEAL SLIDE OSTEOTOMY, MANIPULATION OF THE ANKLE UNDER ANESTHESIA;  Surgeon: Vivi Barrack, DPM;  Location: [facility];  Service: Podiatry;  Laterality: Left;   STERIOD INJECTION Left 05/08/2019   Procedure: STEROID INJECTION;  Surgeon: Vivi Barrack, DPM;  Location: [facility] ;  Service: Podiatry;  Laterality: Left;   TUBAL LIGATION  2003   WISDOM TOOTH EXTRACTION      OB History   No obstetric history on file.      Home Medications    Prior to Admission medications   Medication Sig Start Date End Date Taking? Authorizing Provider  amoxicillin-clavulanate (AUGMENTIN) 875-125 MG tablet Take 1 tablet by mouth 2 (two) times daily. 12/22/20  Yes Bing Neighbors, FNP  olopatadine (PATANOL) 0.1 % ophthalmic solution Place 1 drop into both eyes 2 (two) times daily as needed for allergies. 12/22/20  Yes Bing Neighbors, FNP  predniSONE (DELTASONE) 10 MG tablet Take 1 tablet (10 mg total) by  mouth daily with breakfast for 3 days. 12/22/20 12/25/20 Yes Bing Neighbors, FNP  Cholecalciferol (VITAMIN D3 PO) Take 10,000 Units by mouth daily.     [provider]  clobetasol ointment (TEMOVATE) 0.05 % APPLY OINTMENT TOPICALLY TO SKIN TWICE DAILY FOR 14 DAYS 08/19/19   [provider]  FLUoxetine (PROZAC) 20 MG capsule Take 1 capsule (20 mg total) by mouth daily. 02/13/20 03/14/20  Wilson Singer, MD  levonorgestrel (MIRENA) 20 MCG/24HR IUD by Intrauterine route.    [provider]   nitrofurantoin, macrocrystal-monohydrate, (MACROBID) 100 MG capsule Take 1 capsule (100 mg total) by mouth 2 (two) times daily. Patient not taking: Reported on 12/22/2020 12/09/20   Elenore Paddy, NP  NP THYROID 120 MG tablet Take 1 tablet (120 mg total) by mouth 2 (two) times daily. 04/13/20   Wilson Singer, MD    Family History Family History  Problem Relation Age of Onset   Hyperlipidemia Mother    Hypertension Mother    Arthritis Father    Cancer Father        lymphoma   Heart disease Maternal Grandmother        CHF   Hyperlipidemia Maternal Grandmother    Hypertension Maternal Grandmother    Diabetes Maternal Grandmother    Cancer Paternal Grandmother        ovarian   Kidney disease Paternal Grandfather    Alcohol abuse Paternal Grandfather     Social History Social History   Tobacco Use   Smoking status: Never   Smokeless tobacco: Never  Vaping Use   Vaping Use: Never used  Substance Use Topics   Alcohol use: Yes    Comment: socially wine   Drug use: No     Allergies   Other   Review of Systems Review of Systems Pertinent negatives listed in HPI   Physical Exam Triage Vital Signs ED Triage Vitals  Enc Vitals Group     BP 12/22/20 1857 132/82     Pulse Rate 12/22/20 1857 85     Resp 12/22/20 1857 16     Temp 12/22/20 1857 98.6 F (37 C)     Temp Source 12/22/20 1857 Tympanic     SpO2 12/22/20 1857 98 %     Weight --      Height --      Head Circumference --      Peak Flow --      Pain Score 12/22/20 1905 5     Pain Loc --      Pain Edu? --      Excl. in GC? --    No data found.  Updated Vital Signs BP 132/82 (BP Location: Right Arm)   Pulse 85   Temp 98.6 F (37 C) (Tympanic)   Resp 16   SpO2 98%   Visual Acuity Right Eye Distance:   Left Eye Distance:   Bilateral Distance:    Right Eye Near:   Left Eye Near:    Bilateral Near:     Physical Exam General Appearance:    Alert, cooperative, no distress  HENT:   ENT exam  normal, no neck nodes or sinus tenderness  Eyes:    PERRL, upper lower right eyelid edematous with erythema with purulent drainage noted within the inner canthus of the eye, left eye trace swelling with erythematous conjunctive a and scant drainage no foreign EOM's intact       Lungs:     Clear to auscultation  bilaterally, respirations unlabored  Heart:    Regular rate and rhythm  Neurologic:   Awake, alert, oriented x 3. No apparent focal neurological           defect.        UC Treatments / Results  Labs (all labs ordered are listed, but only abnormal results are displayed) Labs Reviewed - No data to display  EKG   Radiology No results found.  Procedures Procedures (including critical care time)  Medications Ordered in UC Medications - No data to display  Initial Impression / Assessment and Plan / UC Course  I have reviewed the triage vital signs and the nursing notes.  Pertinent labs & imaging results that were available during my care of the patient were reviewed by me and considered in my medical decision making (see chart for details).    Contact blepharoconjunctivitis involving both eyes as left eye is showing signs of infection and irritation related to eyelashes.  Treatment instructions per discharge medication orders.  ER precautions given.  Patient verbalized understanding agree with plan. Final Clinical Impressions(s) / UC Diagnoses   Final diagnoses:  Contact blepharoconjunctivitis of both eyes   Discharge Instructions   None    ED Prescriptions     Medication Sig Dispense Auth. Provider   amoxicillin-clavulanate (AUGMENTIN) 875-125 MG tablet Take 1 tablet by mouth 2 (two) times daily. 20 tablet Bing Neighbors, FNP   predniSONE (DELTASONE) 10 MG tablet Take 1 tablet (10 mg total) by mouth daily with breakfast for 3 days. 3 tablet Bing Neighbors, FNP   olopatadine (PATANOL) 0.1 % ophthalmic solution Place 1 drop into both eyes 2 (two) times daily as  needed for allergies. 5 mL Bing Neighbors, FNP      PDMP not reviewed this encounter.   Bing Neighbors, FNP 12/28/20 2124

## 2020-12-28 ENCOUNTER — Other Ambulatory Visit (INDEPENDENT_AMBULATORY_CARE_PROVIDER_SITE_OTHER): Payer: Self-pay | Admitting: Internal Medicine

## 2020-12-29 ENCOUNTER — Telehealth: Payer: Self-pay | Admitting: Emergency Medicine

## 2020-12-29 MED ORDER — FLUCONAZOLE 150 MG PO TABS: 150.0000 mg | ORAL_TABLET | Freq: Once | ORAL | Status: DC

## 2020-12-29 MED ORDER — FLUCONAZOLE 100 MG PO TABS: 100.0000 mg | ORAL_TABLET | Freq: Every day | ORAL | 0 refills | Status: AC

## 2021-02-11 ENCOUNTER — Telehealth (INDEPENDENT_AMBULATORY_CARE_PROVIDER_SITE_OTHER): Payer: Self-pay

## 2021-02-11 DIAGNOSIS — L309 Dermatitis, unspecified: Secondary | ICD-10-CM

## 2021-02-11 MED ORDER — CLOBETASOL PROPIONATE 0.05 % EX OINT: TOPICAL_OINTMENT | CUTANEOUS | 0 refills | Status: DC

## 2021-02-11 NOTE — Telephone Encounter (Signed)
Refill approved.

## 2021-02-11 NOTE — Telephone Encounter (Signed)
Patient called and stated that she needs a refill of the following medication that she would use as needed:  clobetasol ointment (TEMOVATE) 0.05 %

## 2021-03-17 ENCOUNTER — Ambulatory Visit (INDEPENDENT_AMBULATORY_CARE_PROVIDER_SITE_OTHER): Admitting: Internal Medicine

## 2021-05-10 ENCOUNTER — Other Ambulatory Visit: Payer: Self-pay | Admitting: Surgery

## 2021-05-10 ENCOUNTER — Other Ambulatory Visit (HOSPITAL_COMMUNITY): Payer: Self-pay | Admitting: Surgery

## 2021-05-26 ENCOUNTER — Other Ambulatory Visit (HOSPITAL_COMMUNITY): Payer: Self-pay | Admitting: Surgery

## 2021-05-26 ENCOUNTER — Ambulatory Visit (HOSPITAL_COMMUNITY)
Admission: RE | Admit: 2021-05-26 | Discharge: 2021-05-26 | Disposition: A | Discharge status: Home / Self Care | Payer: Commercial Managed Care - PPO | Source: Ambulatory Visit | Attending: Surgery | Admitting: Surgery

## 2021-05-26 ENCOUNTER — Other Ambulatory Visit: Payer: Self-pay

## 2021-07-07 ENCOUNTER — Encounter: Payer: Commercial Managed Care - PPO | Attending: Surgery | Admitting: Skilled Nursing Facility1

## 2021-07-07 ENCOUNTER — Other Ambulatory Visit: Payer: Self-pay

## 2021-07-07 NOTE — Progress Notes (Signed)
Nutrition Assessment for Bariatric Surgery Medical Nutrition Therapy Appt Start Time: 4:20    End Time: 5:18  Patient was seen on 07/07/2021 for Pre-Operative Nutrition Assessment. Letter of approval faxed to [facility] Surgery bariatric surgery program coordinator on 07/07/2021.   Referral stated Supervised Weight Loss (SWL) visits needed: 0  Pt completed visits.   Pt has cleared nutrition requirements.   Planned surgery: RYGB Pt expectation of surgery: to lose weight  Pt expectation of dietitian: none stated    NUTRITION ASSESSMENT   Anthropometrics  Start weight at [facility]: 246.8 lbs (date: 07/07/2021)  Height: 61.5 in BMI: 46.58 kg/m2     Clinical  Medical hx: anxiety, GERD, hyperlipemia, PCOS, migraines  Medications: see list Labs: LDL 140, iron saturation 14 Notable signs/symptoms: n/a Any previous deficiencies? 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   Pt states she does have a history of using laxatives for weight loss in 2019.  Pt states when she does not plan her meals she just picks up whatever is fast not healthy stating she know she really needs to change this.   24-Hr Dietary Recall First Meal: coffee + sugar + creamer Snack: chips Second Meal: lunchable or hot dog  Snack: candy Third Meal: rice, salad or beef and rice Snack:  Beverages: wine, cocktail, juice, flavored water, carbonated water   Estimated Energy Needs Calories: 1500   NUTRITION DIAGNOSIS  Overweight/obesity ([place]ili-3.3) related to past poor dietary habits and physical inactivity as evidenced by patient w/ planned RYGB surgery following dietary guidelines for continued weight loss.    NUTRITION  INTERVENTION  Nutrition counseling (C-1) and education (E-2) to facilitate bariatric surgery goals.  Educated pt on micronutrient deficiencies post surgery and strategies to mitigate that risk   Pre-Op Goals Reviewed with the Patient Track food and beverage intake (pen and paper, MyFitness Pal, Baritastic app, etc.) Make healthy food choices while monitoring portion sizes Consume 3 meals per day or try to eat every 3-5 hours Avoid concentrated sugars and fried foods Keep sugar & fat in the single digits per serving on food labels Practice CHEWING your food (aim for applesauce consistency) Practice not drinking 15 minutes before, during, and 30 minutes after each meal and snack Avoid all carbonated beverages (ex: soda, sparkling beverages)  Limit caffeinated beverages (ex: coffee, tea, energy drinks) Avoid all sugar-sweetened beverages (ex: regular soda, sports drinks)  Avoid alcohol  Aim for 64-100 ounces of FLUID daily (with at least half of fluid intake being plain water)  Aim for at least 60-80 grams of PROTEIN daily Look for a liquid protein source that contains ?15 g protein and ?5 g carbohydrate (ex: shakes, drinks, shots) Make a list of non-food related activities Physical activity is an important part of a healthy lifestyle so keep it moving! The goal is to reach 150 minutes of exercise per week, including cardiovascular and weight baring activity.  *Goals that are bolded indicate the pt would like to start working towards these  Handouts Provided Include  Bariatric Surgery handouts (Nutrition Visits, Pre-Op Goals, Protein Shakes, Vitamins & Minerals) Detailed MyPlate  Learning Style & Readiness for Change Teaching method utilized: Visual & Auditory  Demonstrated degree of understanding via: Teach Back  Readiness Level: contemplative Barriers to learning/adherence to lifestyle change: none identified    MONITORING & EVALUATION Dietary intake, weekly physical activity, body  weight, and pre-op goals reached at next nutrition visit.    Next Steps  Patient is to follow up at [facility] for Pre-Op Class >2 weeks before surgery for further nutrition education.  Pt has completed visits. No further supervised visits required/recomended

## 2021-07-22 ENCOUNTER — Other Ambulatory Visit (INDEPENDENT_AMBULATORY_CARE_PROVIDER_SITE_OTHER): Payer: Self-pay | Admitting: Nurse Practitioner

## 2021-07-22 DIAGNOSIS — L309 Dermatitis, unspecified: Secondary | ICD-10-CM

## 2021-07-28 ENCOUNTER — Ambulatory Visit (INDEPENDENT_AMBULATORY_CARE_PROVIDER_SITE_OTHER): Payer: Commercial Managed Care - PPO | Admitting: Psychology

## 2021-07-28 DIAGNOSIS — F509 Eating disorder, unspecified: Secondary | ICD-10-CM | POA: Diagnosis not present
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
## 2021-08-02 ENCOUNTER — Other Ambulatory Visit (INDEPENDENT_AMBULATORY_CARE_PROVIDER_SITE_OTHER): Payer: Self-pay | Admitting: Nurse Practitioner

## 2021-08-02 DIAGNOSIS — L309 Dermatitis, unspecified: Secondary | ICD-10-CM

## 2021-08-03 MED ORDER — CLOBETASOL PROPIONATE 0.05 % EX OINT: TOPICAL_OINTMENT | CUTANEOUS | 0 refills | Status: AC

## 2021-08-11 ENCOUNTER — Ambulatory Visit (INDEPENDENT_AMBULATORY_CARE_PROVIDER_SITE_OTHER): Payer: Commercial Managed Care - PPO | Admitting: Psychology

## 2021-08-11 DIAGNOSIS — F509 Eating disorder, unspecified: Secondary | ICD-10-CM | POA: Diagnosis not present
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
## 2021-09-15 ENCOUNTER — Ambulatory Visit: Payer: Self-pay | Admitting: Surgery

## 2021-09-15 NOTE — H&P (View-Only) (Signed)
Wendy Watkins ?[id number] ?Referring Provider:  Self ? ? ?Subjective  ? ?Chief Complaint: return weight ?  ? ? ?History of Present Illness: ?Returns for follow-up regarding surgical treatment of morbid obesity.  She has completed the bariatric pathway with no barriers identified.  Lab work did reveal H. pylori which has been treated.  Upper GI did demonstrate a moderate sized sliding hiatal hernia with mild gastroesophageal reflux.  She has been cleared by dietitian and psychology.  She denies any significant changes in her health since our initial visit.  She is ready to proceed with gastric bypass next month.  Has several insightful questions to discuss today. ? ?Initial visit 05/06/2021 : very pleasant 46 year old woman with history of anxiety, arthritis, Chiari I malformation, asthma, GERD, menorrhagia, iron deficiency anemia, hyperlipidemia, hypothyroidism, migraines, PCOS, urge incontinence, vitamin D deficiency, and obesity who presents for consultation regarding surgical management of the latter.  She is here today with her husband. ?She has been struggling with her weight for essentially her entire life, and notes that being overweight runs in her family.  She has tried innumerable diet plans and medications, most recently Optavia, and is able to lose some weight but ultimately regains all of this whenever the intervention is stopped.  She is very fearful of developing complications such as hypertension or diabetes which also run in her family, and is interested in surgical treatment in order to aggressively treat her obesity and avoid developing these sequelae. ?In general she reports that her lifestyle is not well-balanced.  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  Her eating habits are quite irregular.  She does not always eat dinner, for example some nights she will just have wine and chips, she does have a proclivity for candy and sweets, and in general  endorses that her eating patterns are not healthy.  She does not exercise currently.  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  She and her husband have 3 kids, the youngest is 34- 2 are in college and another is working. ?She does note that she has highly symptomatic reflux, and takes over-the-counter antacids nightly.  She has nighttime regurgitation, acid brash and heartburn.  She did try prescribed Protonix at one point but did not feel that it improved her symptoms significantly.  She has been told in the past that she has a hiatal hernia. ? ? ?Previous abdominal surgery includes tubal ligation. ?Denies any tobacco use or significant NSAID use. ? ? ?Review of Systems: ?A complete review of systems was obtained from the patient.  I have reviewed this information and discussed as appropriate with the patient.  See HPI as well for other ROS. ? ? ?Medical History: ?Past Medical History:  ?Diagnosis Date  ? Anxiety   ? GERD (gastroesophageal reflux disease)   ? Thyroid disease   ? ? ?There is no problem list on file for this patient. ? ? ?Past Surgical History:  ?Procedure Laterality Date  ? RECONSTRUCTION CLEFT FOOT Bilateral   ?  ? ?Allergies  ?Allergen Reactions  ? Doxycycline Nausea And Vomiting  ? ? ?Current Outpatient Medications on File Prior to Visit  ?Medication Sig Dispense Refill  ? FLUoxetine (PROZAC) 20 MG capsule Take 20 mg by mouth once daily    ? levonorgestreL (MIRENA 52 MG) 20 mcg/24 hr (8 years) IUD Insert into the uterus    ? thyroid (NP THYROID) 120 mg tablet  TAKE 1 TABLET BY MOUTH ONCE DAILY BEFORE BREAKFAST    ? ?No current facility-administered medications on file prior to visit.  ? ? ?Family History  ?Problem Relation Age of Onset  ? Skin cancer Mother   ? Obesity Mother   ? High blood pressure (Hypertension) Mother   ? Hyperlipidemia (Elevated cholesterol) Mother   ? Diabetes Mother   ? High blood pressure (Hypertension) Father   ? Hyperlipidemia (Elevated cholesterol) Father    ?  ? ?Social History  ? ?Tobacco Use  ?Smoking Status Never  ?Smokeless Tobacco Never  ?  ? ?Social History  ? ?Socioeconomic History  ? Marital status: Married  ?Tobacco Use  ? Smoking status: Never  ? Smokeless tobacco: Never  ?Vaping Use  ? Vaping Use: Never used  ?Substance and Sexual Activity  ? Alcohol use: Yes  ? Drug use: Not Currently  ? Sexual activity: Yes  ? ? ?Objective:  ? ? ?Vitals:  ? 09/15/21 0909  ?BP: 118/80  ?Pulse: 87  ?Temp: 36.8 ?C (98.3 ?F)  ?SpO2: 97%  ?Weight: (!) 114 kg (251 lb 6.4 oz)  ?Height: 154.9 cm (5\' 1" )  ?  ?Body mass index is 47.5 kg/m?. ? ?A&Ox3 ?Unlabored respirations ? ? ?Assessment and Plan:  ?Diagnoses and all orders for this visit: ? ?Morbid obesity (CMS-HCC) ?Comments: ?BMI 30=159lb ? ?  ?She remains an excellent candidate for bariatric surgery and after further consideration following her initial visit, would like to proceed with Roux-en-Y gastric bypass.  We have previously discussed the surgery in detail, but again today went over the technical aspects, risks of bleeding, infection, pain, scarring, injury to intra-abdominal structures, anastomotic or staple line leak, bowel obstruction, internal hernia, marginal ulcer, chronic abdominal pain or nausea, change in bowel function, malabsorption/malnutrition and vitamin deficiencies, as well as systemic cardiovascular/pulmonary/thromboembolic risks.  Questions welcomed and answered to her satisfaction.  We will plan to proceed with Roux-en-Y gastric bypass next month. ? ? ?Aaliyah Cancro Raquel James, MD  ? ?

## 2021-09-15 NOTE — H&P (Signed)
Wendy Watkins ?[id number]  ? ?Referring Provider:  Self ? ? ?Subjective  ? ?Chief Complaint: return weight ?  ? ? ?History of Present Illness: ?Returns for follow-up regarding surgical treatment of morbid obesity.  She has completed the bariatric pathway with no barriers identified.  Lab work did reveal H. pylori which has been treated.  Upper GI did demonstrate a moderate sized sliding hiatal hernia with mild gastroesophageal reflux.  She has been cleared by dietitian and psychology.  She denies any significant changes in her health since our initial visit.  She is ready to proceed with gastric bypass next month.  Has several insightful questions to discuss today. ? ?Initial visit 05/06/2021 : very pleasant 45-year-old woman with history of anxiety, arthritis, Chiari I malformation, asthma, GERD, menorrhagia, iron deficiency anemia, hyperlipidemia, hypothyroidism, migraines, PCOS, urge incontinence, vitamin D deficiency, and obesity who presents for consultation regarding surgical management of the latter.  She is here today with her husband. ?She has been struggling with her weight for essentially her entire life, and notes that being overweight runs in her family.  She has tried innumerable diet plans and medications, most recently Optavia, and is able to lose some weight but ultimately regains all of this whenever the intervention is stopped.  She is very fearful of developing complications such as hypertension or diabetes which also run in her family, and is interested in surgical treatment in order to aggressively treat her obesity and avoid developing these sequelae. ?In general she reports that her lifestyle is not well-balanced.  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  Her eating habits are quite irregular.  She does not always eat dinner, for example some nights she will just have wine and chips, she does have a proclivity for candy and sweets, and in general  endorses that her eating patterns are not healthy.  She does not exercise currently.  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  She and her husband have 3 kids, the youngest is 19- 2 are in college and another is working. ?She does note that she has highly symptomatic reflux, and takes over-the-counter antacids nightly.  She has nighttime regurgitation, acid brash and heartburn.  She did try prescribed Protonix at one point but did not feel that it improved her symptoms significantly.  She has been told in the past that she has a hiatal hernia. ? ? ?Previous abdominal surgery includes tubal ligation. ?Denies any tobacco use or significant NSAID use. ? ? ?Review of Systems: ?A complete review of systems was obtained from the patient.  I have reviewed this information and discussed as appropriate with the patient.  See HPI as well for other ROS. ? ? ?Medical History: ?Past Medical History:  ?Diagnosis Date  ? Anxiety   ? GERD (gastroesophageal reflux disease)   ? Thyroid disease   ? ? ?There is no problem list on file for this patient. ? ? ?Past Surgical History:  ?Procedure Laterality Date  ? RECONSTRUCTION CLEFT FOOT Bilateral   ?  ? ?Allergies  ?Allergen Reactions  ? Doxycycline Nausea And Vomiting  ? ? ?Current Outpatient Medications on File Prior to Visit  ?Medication Sig Dispense Refill  ? FLUoxetine (PROZAC) 20 MG capsule Take 20 mg by mouth once daily    ? levonorgestreL (MIRENA 52 MG) 20 mcg/24 hr (8 years) IUD Insert into the uterus    ? thyroid (NP THYROID) 120 mg tablet   TAKE 1 TABLET BY MOUTH ONCE DAILY BEFORE BREAKFAST    ? ?No current facility-administered medications on file prior to visit.  ? ? ?Family History  ?Problem Relation Age of Onset  ? Skin cancer Mother   ? Obesity Mother   ? High blood pressure (Hypertension) Mother   ? Hyperlipidemia (Elevated cholesterol) Mother   ? Diabetes Mother   ? High blood pressure (Hypertension) Father   ? Hyperlipidemia (Elevated cholesterol) Father    ?  ? ?Social History  ? ?Tobacco Use  ?Smoking Status Never  ?Smokeless Tobacco Never  ?  ? ?Social History  ? ?Socioeconomic History  ? Marital status: Married  ?Tobacco Use  ? Smoking status: Never  ? Smokeless tobacco: Never  ?Vaping Use  ? Vaping Use: Never used  ?Substance and Sexual Activity  ? Alcohol use: Yes  ? Drug use: Not Currently  ? Sexual activity: Yes  ? ? ?Objective:  ? ? ?Vitals:  ? 09/15/21 0909  ?BP: 118/80  ?Pulse: 87  ?Temp: 36.8 ?C (98.3 ?F)  ?SpO2: 97%  ?Weight: (!) 114 kg (251 lb 6.4 oz)  ?Height: 154.9 cm (5' 1")  ?  ?Body mass index is 47.5 kg/m?. ? ?A&Ox3 ?Unlabored respirations ? ? ?Assessment and Plan:  ?Diagnoses and all orders for this visit: ? ?Morbid obesity (CMS-HCC) ?Comments: ?BMI 30=159lb ? ?  ?She remains an excellent candidate for bariatric surgery and after further consideration following her initial visit, would like to proceed with Roux-en-Y gastric bypass.  We have previously discussed the surgery in detail, but again today went over the technical aspects, risks of bleeding, infection, pain, scarring, injury to intra-abdominal structures, anastomotic or staple line leak, bowel obstruction, internal hernia, marginal ulcer, chronic abdominal pain or nausea, change in bowel function, malabsorption/malnutrition and vitamin deficiencies, as well as systemic cardiovascular/pulmonary/thromboembolic risks.  Questions welcomed and answered to her satisfaction.  We will plan to proceed with Roux-en-Y gastric bypass next month. ? ? ?Baelynn Schmuhl AMANDA Marisue Canion, MD  ? ?

## 2021-09-22 ENCOUNTER — Other Ambulatory Visit (HOSPITAL_COMMUNITY): Payer: Self-pay

## 2021-09-24 NOTE — Progress Notes (Addendum)
Anesthesia Review: ? ?PCP: none  ?Cardiologist : ?Chest x-ray :05/26/21  ?EKG :05/26/21  ?Echo : ?Stress test: ?Cardiac Cath :  ?Activity level: can do a flight of stairs without difficulty  ?Sleep Study/ CPAP : none  ?Fasting Blood Sugar :      / Checks Blood Sugar -- times a day:   ?Blood Thinner/ Instructions /Last Dose: ?ASA / Instructions/ Last Dose :   ?

## 2021-09-27 ENCOUNTER — Encounter: Payer: Commercial Managed Care - PPO | Attending: Surgery | Admitting: Skilled Nursing Facility1

## 2021-09-27 ENCOUNTER — Other Ambulatory Visit: Payer: Self-pay

## 2021-09-27 NOTE — Progress Notes (Signed)
DUE TO COVID-19 ONLY ONE VISITOR IS ALLOWED TO COME WITH YOU AND STAY IN THE WAITING ROOM ONLY DURING PRE OP AND PROCEDURE DAY OF SURGERY.  2 VISITOR  MAY VISIT WITH YOU AFTER SURGERY IN YOUR PRIVATE ROOM DURING VISITING HOURS ONLY! ?YOU MAY HAVE ONE PERSON SPEND THE NITE WITH YOU IN YOUR ROOM AFTER SURGERY.   ? ? ? ? Your procedure is scheduled on:  ?  10/11/2021 ? Report to [facility] Main  Entrance ? ? Report to admitting at     0915            AM ?DO NOT BRING INSURANCE CARD, PICTURE ID OR WALLET DAY OF SURGERY.  ?  ? ? Call this number if you have problems the morning of surgery [phone]  ? ? REMEMBER: NO  SOLID FOODS , CANDY, GUM OR MINTS AFTER MIDNITE THE NITE BEFORE SURGERY .       Marland Kitchen CLEAR LIQUIDS UNTIL     0830am            DAY OF SURGERY.      PLEASE FINISH G2 Lower sugar DRINK PER SURGEON ORDER  WHICH NEEDS TO BE COMPLETED AT     0830am       MORNING OF SURGERY.   ? ? ? ? ?CLEAR LIQUID DIET ? ? ?Foods Allowed      ?WATER ?BLACK COFFEE ( SUGAR OK, NO MILK, CREAM OR CREAMER) REGULAR AND DECAF  ?TEA ( SUGAR OK NO MILK, CREAM, OR CREAMER) REGULAR AND DECAF  ?PLAIN JELLO ( NO RED)  ?FRUIT ICES ( NO RED, NO FRUIT PULP)  ?POPSICLES ( NO RED)  ?JUICE- APPLE, WHITE GRAPE AND WHITE CRANBERRY  ?SPORT DRINK LIKE GATORADE ( NO RED)  ?CLEAR BROTH ( VEGETABLE , CHICKEN OR BEEF)                                                               ? ?    ? ?BRUSH YOUR TEETH MORNING OF SURGERY AND RINSE YOUR MOUTH OUT, NO CHEWING GUM CANDY OR MINTS. ?  ? ? Take these medicines the morning of surgery with A SIP OF WATER:  nexium, prozac, NP Thyroid  ? ? ?DO NOT TAKE ANY DIABETIC MEDICATIONS DAY OF YOUR SURGERY ?                  ?            You may not have any metal on your body including hair pins and  ?            piercings  Do not wear jewelry, make-up, lotions, powders or perfumes, deodorant ?            Do not wear nail polish on your fingernails.   ?           IF YOU ARE A FEMALE AND WANT TO SHAVE UNDER ARMS  OR LEGS PRIOR TO SURGERY YOU MUST DO SO AT LEAST 48 HOURS PRIOR TO SURGERY.  ?            Men may shave face and neck. ? ? Do not bring valuables to the hospital. [facility] IS NOT ?  RESPONSIBLE   FOR VALUABLES. ? Contacts, dentures or bridgework may not be worn into surgery. ? Leave suitcase in the car. After surgery it may be brought to your room. ? ?  ? Patients discharged the day of surgery will not be allowed to drive home. IF YOU ARE HAVING SURGERY AND GOING HOME THE SAME DAY, YOU MUST HAVE AN ADULT TO DRIVE YOU HOME AND BE WITH YOU FOR 24 HOURS. YOU MAY GO HOME BY TAXI OR UBER OR ORTHERWISE, BUT AN ADULT MUST ACCOMPANY YOU HOME AND STAY WITH YOU FOR 24 HOURS. ?  ? ?            Please read over the following fact sheets you were given: ?_____________________________________________________________________ ? ?[facility] - Preparing for Surgery ?Before surgery, you can play an important role.  Because skin is not sterile, your skin needs to be as free of germs as possible.  You can reduce the number of germs on your skin by washing with CHG (chlorahexidine gluconate) soap before surgery.  CHG is an antiseptic cleaner which kills germs and bonds with the skin to continue killing germs even after washing. ?Please DO NOT use if you have an allergy to CHG or antibacterial soaps.  If your skin becomes reddened/irritated stop using the CHG and inform your nurse when you arrive at Short Stay. ?Do not shave (including legs and underarms) for at least 48 hours prior to the first CHG shower.  You may shave your face/neck. ?Please follow these instructions carefully: ? 1.  Shower with CHG Soap the night before surgery and the  morning of Surgery. ? 2.  If you choose to wash your hair, wash your hair first as usual with your  normal  shampoo. ? 3.  After you shampoo, rinse your hair and body thoroughly to remove the  shampoo.                           4.  Use CHG as you would any other liquid soap.  You can  apply chg directly  to the skin and wash  ?                     Gently with a scrungie or clean washcloth. ? 5.  Apply the CHG Soap to your body ONLY FROM THE NECK DOWN.   Do not use on face/ open      ?                     Wound or open sores. Avoid contact with eyes, ears mouth and genitals (private parts).  ?                     Production manager,  Genitals (private parts) with your normal soap. ?            6.  Wash thoroughly, paying special attention to the area where your surgery  will be performed. ? 7.  Thoroughly rinse your body with warm water from the neck down. ? 8.  DO NOT shower/wash with your normal soap after using and rinsing off  the CHG Soap. ?               9.  Pat yourself dry with a clean towel. ?           10.  Wear clean pajamas. ?  11.  Place clean sheets on your bed the night of your first shower and do not  sleep with pets. ?Day of Surgery : ?Do not apply any lotions/deodorants the morning of surgery.  Please wear clean clothes to the hospital/surgery center. ? ?FAILURE TO FOLLOW THESE INSTRUCTIONS MAY RESULT IN THE CANCELLATION OF YOUR SURGERY ?PATIENT SIGNATURE_________________________________ ? ?NURSE SIGNATURE__________________________________ ? ?________________________________________________________________________  ? ? ?           ?

## 2021-09-28 ENCOUNTER — Encounter (HOSPITAL_COMMUNITY): Payer: Self-pay

## 2021-09-28 ENCOUNTER — Other Ambulatory Visit: Payer: Self-pay

## 2021-09-28 ENCOUNTER — Encounter (HOSPITAL_COMMUNITY)
Admission: RE | Admit: 2021-09-28 | Discharge: 2021-09-28 | Disposition: A | Discharge status: Home / Self Care | Payer: Commercial Managed Care - PPO | Source: Ambulatory Visit | Attending: Surgery | Admitting: Surgery

## 2021-09-28 DIAGNOSIS — Z01812 Encounter for preprocedural laboratory examination: Secondary | ICD-10-CM | POA: Insufficient documentation

## 2021-09-28 LAB — COMPREHENSIVE METABOLIC PANEL
ALT: 11 U/L (ref 0–44)
AST: 16 U/L (ref 15–41)
Albumin: 4 g/dL (ref 3.5–5.0)
Alkaline Phosphatase: 96 U/L (ref 38–126)
Anion gap: 7 (ref 5–15)
BUN: 18 mg/dL (ref 6–20)
CO2: 25 mmol/L (ref 22–32)
Calcium: 9.2 mg/dL (ref 8.9–10.3)
Chloride: 103 mmol/L (ref 98–111)
Creatinine, Ser: 0.92 mg/dL (ref 0.44–1.00)
GFR, Estimated: >60 mL/min (ref >60)
Glucose, Bld: 95 mg/dL (ref 70–99)
Potassium: 4 mmol/L (ref 3.5–5.1)
Sodium: 135 mmol/L (ref 135–145)
Total Bilirubin: 0.5 mg/dL (ref 0.3–1.2)
Total Protein: 7.9 g/dL (ref 6.5–8.1)

## 2021-09-28 LAB — CBC WITH DIFFERENTIAL/PLATELET
Abs Immature Granulocytes: 0.01 10*3/uL (ref 0.00–0.07)
Basophils Absolute: 0.1 10*3/uL (ref 0.0–0.1)
Basophils Relative: 1 %
Eosinophils Absolute: 0.1 10*3/uL (ref 0.0–0.5)
Eosinophils Relative: 2 %
HCT: 38.3 % (ref 36.0–46.0)
Hemoglobin: 12.4 g/dL (ref 12.0–15.0)
Immature Granulocytes: 0 %
Lymphocytes Relative: 31 %
Lymphs Abs: 2.5 10*3/uL (ref 0.7–4.0)
MCH: 30.2 pg (ref 26.0–34.0)
MCHC: 32.4 g/dL (ref 30.0–36.0)
MCV: 93.4 fL (ref 80.0–100.0)
Monocytes Absolute: 0.5 10*3/uL (ref 0.1–1.0)
Monocytes Relative: 7 %
Neutro Abs: 4.9 10*3/uL (ref 1.7–7.7)
Neutrophils Relative %: 59 %
Platelets: 451 10*3/uL — ABNORMAL HIGH (ref 150–400)
RBC: 4.1 MIL/uL (ref 3.87–5.11)
RDW: 13.2 % (ref 11.5–15.5)
WBC: 8.1 10*3/uL (ref 4.0–10.5)
nRBC: 0 % (ref 0.0–0.2)

## 2021-09-28 LAB — TYPE AND SCREEN
ABO/RH(D): A POS
Antibody Screen: NEGATIVE

## 2021-09-28 NOTE — Progress Notes (Signed)
Pre-Operative Nutrition Class:   ? ?Patient was seen on 09/27/2021 for Pre-Operative Bariatric Surgery Education at the [facility].   ? ?Surgery date: 10/11/2021 ?Surgery type: Laparoscopic Roux-En-Y Gastric Bypass ?Start weight at [facility]: 246.8 ?Weight today: 247.9 ? ?Samples given per MNT protocol. Patient educated on appropriate usage: ?Bariatric Advantage Multivitamin ?Lot #[id number] ?Exp:10/23 ? ?Bariatric Advantage Calcium  ?Lot #[id number] ?Exp:05/09/2022 ? ?Protein Shake Ensure Max ?Lot # [id number] ?Exp: 08/04/2022 ? ?Bariatric Advantage Protein Powder ?Lot # [id number] ?Exp: 02/24 ? ?The following the learning objectives were met by the patient during this course: ?Identify Pre-Op Dietary Goals and will begin 2 weeks pre-operatively ?Identify appropriate sources of fluids and proteins  ?State protein recommendations and appropriate sources pre and post-operatively ?Identify Post-Operative Dietary Goals and will follow for 2 weeks post-operatively ?Identify appropriate multivitamin and calcium sources ?Describe the need for physical activity post-operatively and will follow MD recommendations ?State when to call healthcare provider regarding medication questions or post-operative complications ?When having a diagnosis of diabetes understanding hypoglycemia symptoms and the inclusion of 1 complex carbohydrate per meal ? ?Handouts given during class include: ?Pre-Op Bariatric Surgery Diet Handout ?Protein Shake Handout ?Post-Op Bariatric Surgery Nutrition Handout ?[facility]ELT Program Information Flyer ?Support Group Information Flyer ?[facility] Bariatric Supplements Price List ? ?Follow-Up Plan: ?Patient will follow-up at [facility] 2 weeks post operatively for diet advancement per MD.  ?

## 2021-09-29 ENCOUNTER — Other Ambulatory Visit (INDEPENDENT_AMBULATORY_CARE_PROVIDER_SITE_OTHER): Payer: Self-pay | Admitting: Nurse Practitioner

## 2021-09-29 DIAGNOSIS — L309 Dermatitis, unspecified: Secondary | ICD-10-CM

## 2021-10-11 ENCOUNTER — Inpatient Hospital Stay (HOSPITAL_COMMUNITY): Payer: Commercial Managed Care - PPO | Admitting: Certified Registered Nurse Anesthetist

## 2021-10-11 ENCOUNTER — Inpatient Hospital Stay (HOSPITAL_COMMUNITY)
Admission: RE | Admit: 2021-10-11 | Discharge: 2021-10-13 | DRG: 621 | Disposition: A | Discharge status: Home / Self Care | Payer: Commercial Managed Care - PPO | Source: Ambulatory Visit | Attending: Surgery | Admitting: Surgery

## 2021-10-11 ENCOUNTER — Encounter (HOSPITAL_COMMUNITY)
Admission: RE | Disposition: A | Discharge status: Home / Self Care | Payer: Self-pay | Source: Ambulatory Visit | Attending: Surgery

## 2021-10-11 ENCOUNTER — Other Ambulatory Visit: Payer: Self-pay

## 2021-10-11 ENCOUNTER — Encounter (HOSPITAL_COMMUNITY): Payer: Self-pay | Admitting: Surgery

## 2021-10-11 DIAGNOSIS — Z8249 Family history of ischemic heart disease and other diseases of the circulatory system: Secondary | ICD-10-CM | POA: Diagnosis not present

## 2021-10-11 DIAGNOSIS — Z808 Family history of malignant neoplasm of other organs or systems: Secondary | ICD-10-CM

## 2021-10-11 DIAGNOSIS — K449 Diaphragmatic hernia without obstruction or gangrene: Secondary | ICD-10-CM | POA: Diagnosis present

## 2021-10-11 DIAGNOSIS — Z79899 Other long term (current) drug therapy: Secondary | ICD-10-CM

## 2021-10-11 DIAGNOSIS — Z833 Family history of diabetes mellitus: Secondary | ICD-10-CM

## 2021-10-11 DIAGNOSIS — Z6841 Body Mass Index (BMI) 40.0 and over, adult: Secondary | ICD-10-CM

## 2021-10-11 DIAGNOSIS — E785 Hyperlipidemia, unspecified: Secondary | ICD-10-CM | POA: Diagnosis present

## 2021-10-11 DIAGNOSIS — E039 Hypothyroidism, unspecified: Secondary | ICD-10-CM

## 2021-10-11 DIAGNOSIS — K219 Gastro-esophageal reflux disease without esophagitis: Secondary | ICD-10-CM | POA: Diagnosis present

## 2021-10-11 HISTORY — PX: GASTRIC ROUX-EN-Y: SHX5262

## 2021-10-11 HISTORY — PX: HIATAL HERNIA REPAIR: SHX195

## 2021-10-11 HISTORY — PX: UPPER GI ENDOSCOPY: SHX6162

## 2021-10-11 LAB — ABO/RH: ABO/RH(D): A POS

## 2021-10-11 LAB — PREGNANCY, URINE: Preg Test, Ur: NEGATIVE

## 2021-10-11 SURGERY — LAPAROSCOPIC ROUX-EN-Y GASTRIC BYPASS WITH UPPER ENDOSCOPY
Anesthesia: General

## 2021-10-11 MED ORDER — BUPIVACAINE LIPOSOME 1.3 % IJ SUSP
INTRAMUSCULAR | Status: AC
  Filled 2021-10-11: qty 20

## 2021-10-11 MED ORDER — PHENYLEPHRINE 40 MCG/ML (10ML) SYRINGE FOR IV PUSH (FOR BLOOD PRESSURE SUPPORT)
PREFILLED_SYRINGE | INTRAVENOUS | Status: DC | PRN
  Administered 2021-10-11 (×3): 120 ug via INTRAVENOUS
  Administered 2021-10-11: 80 ug via INTRAVENOUS

## 2021-10-11 MED ORDER — SODIUM CHLORIDE 0.9 % IV SOLN: INTRAVENOUS | Status: DC

## 2021-10-11 MED ORDER — PANTOPRAZOLE SODIUM 40 MG IV SOLR
40.0000 mg | Freq: Every day | INTRAVENOUS | Status: DC
  Administered 2021-10-11 – 2021-10-12 (×2): 40 mg via INTRAVENOUS
  Filled 2021-10-11 (×3): qty 10

## 2021-10-11 MED ORDER — FIBRIN SEALANT 2 ML SINGLE DOSE KIT
2.0000 mL | PACK | Freq: Once | CUTANEOUS | Status: AC
Start: 2021-10-11 — End: 2021-10-11
  Administered 2021-10-11: 2 mL via TOPICAL
  Filled 2021-10-11: qty 2

## 2021-10-11 MED ORDER — ACETAMINOPHEN 500 MG PO TABS
1000.0000 mg | ORAL_TABLET | Freq: Three times a day (TID) | ORAL | Status: DC
  Administered 2021-10-11 – 2021-10-13 (×4): 1000 mg via ORAL
  Filled 2021-10-11 (×6): qty 2

## 2021-10-11 MED ORDER — GABAPENTIN 300 MG PO CAPS
300.0000 mg | ORAL_CAPSULE | ORAL | Status: AC
  Administered 2021-10-11: 300 mg via ORAL
  Filled 2021-10-11: qty 1

## 2021-10-11 MED ORDER — BUPIVACAINE LIPOSOME 1.3 % IJ SUSP
INTRAMUSCULAR | Status: DC | PRN
  Administered 2021-10-11: 20 mL

## 2021-10-11 MED ORDER — DEXAMETHASONE SODIUM PHOSPHATE 10 MG/ML IJ SOLN
INTRAMUSCULAR | Status: DC | PRN
  Administered 2021-10-11: 8 mg via INTRAVENOUS

## 2021-10-11 MED ORDER — DOCUSATE SODIUM 100 MG PO CAPS: 100.0000 mg | ORAL_CAPSULE | Freq: Two times a day (BID) | ORAL | Status: DC

## 2021-10-11 MED ORDER — METHOCARBAMOL 1000 MG/10ML IJ SOLN
500.0000 mg | Freq: Four times a day (QID) | INTRAMUSCULAR | Status: DC | PRN
  Administered 2021-10-13: 500 mg via INTRAVENOUS
  Filled 2021-10-11: qty 5
  Filled 2021-10-11: qty 500

## 2021-10-11 MED ORDER — ACETAMINOPHEN 160 MG/5ML PO SOLN: 1000.0000 mg | Freq: Three times a day (TID) | ORAL | Status: DC

## 2021-10-11 MED ORDER — LACTATED RINGERS IV SOLN
INTRAVENOUS | Status: DC
Start: 2021-10-11 — End: 2021-10-11

## 2021-10-11 MED ORDER — METOCLOPRAMIDE HCL 5 MG/ML IJ SOLN
10.0000 mg | Freq: Four times a day (QID) | INTRAMUSCULAR | Status: DC
  Administered 2021-10-11 – 2021-10-13 (×7): 10 mg via INTRAVENOUS
  Filled 2021-10-11 (×9): qty 2

## 2021-10-11 MED ORDER — ONDANSETRON HCL 4 MG/2ML IJ SOLN
INTRAMUSCULAR | Status: AC
  Filled 2021-10-11: qty 2

## 2021-10-11 MED ORDER — HYDROMORPHONE HCL 1 MG/ML IJ SOLN
0.5000 mg | INTRAMUSCULAR | Status: DC | PRN
  Administered 2021-10-12: 0.5 mg via INTRAVENOUS

## 2021-10-11 MED ORDER — ACETAMINOPHEN 500 MG PO TABS: 1000.0000 mg | ORAL_TABLET | Freq: Once | ORAL | Status: DC

## 2021-10-11 MED ORDER — CHLORHEXIDINE GLUCONATE 4 % EX LIQD: 60.0000 mL | Freq: Once | CUTANEOUS | Status: DC

## 2021-10-11 MED ORDER — CHLORHEXIDINE GLUCONATE 0.12 % MT SOLN
15.0000 mL | Freq: Once | OROMUCOSAL | Status: AC
  Administered 2021-10-11: 15 mL via OROMUCOSAL

## 2021-10-11 MED ORDER — PROPOFOL 10 MG/ML IV BOLUS
INTRAVENOUS | Status: AC
  Filled 2021-10-11: qty 20

## 2021-10-11 MED ORDER — PHENYLEPHRINE HCL (PRESSORS) 10 MG/ML IV SOLN
INTRAVENOUS | Status: AC
  Filled 2021-10-11: qty 1

## 2021-10-11 MED ORDER — THYROID 120 MG PO TABS
120.0000 mg | ORAL_TABLET | Freq: Two times a day (BID) | ORAL | Status: DC
  Administered 2021-10-11: 120 mg via ORAL
  Filled 2021-10-11 (×3): qty 1

## 2021-10-11 MED ORDER — PHENYLEPHRINE 40 MCG/ML (10ML) SYRINGE FOR IV PUSH (FOR BLOOD PRESSURE SUPPORT)
PREFILLED_SYRINGE | INTRAVENOUS | Status: AC
  Filled 2021-10-11: qty 10

## 2021-10-11 MED ORDER — SUGAMMADEX SODIUM 200 MG/2ML IV SOLN
INTRAVENOUS | Status: DC | PRN
  Administered 2021-10-11: 200 mg via INTRAVENOUS

## 2021-10-11 MED ORDER — HEPARIN SODIUM (PORCINE) 5000 UNIT/ML IJ SOLN
5000.0000 [IU] | INTRAMUSCULAR | Status: AC
  Administered 2021-10-11: 5000 [IU] via SUBCUTANEOUS
  Filled 2021-10-11: qty 1

## 2021-10-11 MED ORDER — APREPITANT 40 MG PO CAPS
40.0000 mg | ORAL_CAPSULE | ORAL | Status: AC
  Administered 2021-10-11: 40 mg via ORAL
  Filled 2021-10-11: qty 1

## 2021-10-11 MED ORDER — PHENYLEPHRINE HCL-NACL 20-0.9 MG/250ML-% IV SOLN
INTRAVENOUS | Status: DC | PRN
Start: 2021-10-11 — End: 2021-10-11
  Administered 2021-10-11: 60 ug/min via INTRAVENOUS

## 2021-10-11 MED ORDER — FENTANYL CITRATE (PF) 100 MCG/2ML IJ SOLN
INTRAMUSCULAR | Status: AC
Start: 2021-10-11 — End: ?
  Filled 2021-10-11: qty 2

## 2021-10-11 MED ORDER — HYDROMORPHONE HCL 1 MG/ML IJ SOLN
0.2500 mg | INTRAMUSCULAR | Status: DC | PRN
  Administered 2021-10-11 (×2): 0.5 mg via INTRAVENOUS

## 2021-10-11 MED ORDER — SIMETHICONE 80 MG PO CHEW
80.0000 mg | CHEWABLE_TABLET | Freq: Four times a day (QID) | ORAL | Status: DC | PRN
  Filled 2021-10-11: qty 1

## 2021-10-11 MED ORDER — BUPIVACAINE LIPOSOME 1.3 % IJ SUSP: 20.0000 mL | Freq: Once | INTRAMUSCULAR | Status: DC

## 2021-10-11 MED ORDER — FLUOXETINE HCL 20 MG PO CAPS
20.0000 mg | ORAL_CAPSULE | Freq: Every day | ORAL | Status: DC
  Administered 2021-10-11 – 2021-10-12 (×2): 20 mg via ORAL
  Filled 2021-10-11 (×4): qty 1

## 2021-10-11 MED ORDER — ROCURONIUM BROMIDE 10 MG/ML (PF) SYRINGE
PREFILLED_SYRINGE | INTRAVENOUS | Status: DC | PRN
Start: 2021-10-11 — End: 2021-10-11
  Administered 2021-10-11: 80 mg via INTRAVENOUS

## 2021-10-11 MED ORDER — ORAL CARE MOUTH RINSE: 15.0000 mL | Freq: Once | OROMUCOSAL | Status: AC

## 2021-10-11 MED ORDER — HYDROMORPHONE HCL 1 MG/ML IJ SOLN
INTRAMUSCULAR | Status: AC
  Filled 2021-10-11: qty 1

## 2021-10-11 MED ORDER — SCOPOLAMINE 1 MG/3DAYS TD PT72
1.0000 | MEDICATED_PATCH | TRANSDERMAL | Status: DC
Start: 2021-10-11 — End: 2021-10-11

## 2021-10-11 MED ORDER — LACTATED RINGERS IR SOLN
Status: DC | PRN
Start: 2021-10-11 — End: 2021-10-11
  Administered 2021-10-11: 1000 mL

## 2021-10-11 MED ORDER — BUPIVACAINE-EPINEPHRINE 0.25% -1:200000 IJ SOLN
INTRAMUSCULAR | Status: DC | PRN
  Administered 2021-10-11: 30 mL

## 2021-10-11 MED ORDER — MIDAZOLAM HCL 2 MG/2ML IJ SOLN
INTRAMUSCULAR | Status: AC
  Filled 2021-10-11: qty 2

## 2021-10-11 MED ORDER — STERILE WATER FOR IRRIGATION IR SOLN
Status: DC | PRN
  Administered 2021-10-11: 1000 mL

## 2021-10-11 MED ORDER — ONDANSETRON HCL 4 MG/2ML IJ SOLN
4.0000 mg | INTRAMUSCULAR | Status: DC | PRN
  Administered 2021-10-12: 4 mg via INTRAVENOUS
  Filled 2021-10-11 (×3): qty 2

## 2021-10-11 MED ORDER — FENTANYL CITRATE (PF) 100 MCG/2ML IJ SOLN
INTRAMUSCULAR | Status: DC | PRN
Start: 2021-10-11 — End: 2021-10-11
  Administered 2021-10-11: 100 ug via INTRAVENOUS

## 2021-10-11 MED ORDER — ROCURONIUM BROMIDE 10 MG/ML (PF) SYRINGE
PREFILLED_SYRINGE | INTRAVENOUS | Status: AC
  Filled 2021-10-11: qty 10

## 2021-10-11 MED ORDER — ENSURE MAX PROTEIN PO LIQD
2.0000 [oz_av] | ORAL | Status: DC
  Administered 2021-10-12 – 2021-10-13 (×7): 2 [oz_av] via ORAL
  Filled 2021-10-11 (×20): qty 330

## 2021-10-11 MED ORDER — ONDANSETRON HCL 4 MG/2ML IJ SOLN: 4.0000 mg | Freq: Once | INTRAMUSCULAR | Status: DC | PRN

## 2021-10-11 MED ORDER — MIDAZOLAM HCL 5 MG/5ML IJ SOLN
INTRAMUSCULAR | Status: DC | PRN
  Administered 2021-10-11: 2 mg via INTRAVENOUS

## 2021-10-11 MED ORDER — TRAMADOL HCL 50 MG PO TABS
50.0000 mg | ORAL_TABLET | Freq: Four times a day (QID) | ORAL | Status: DC | PRN
Start: 2021-10-11 — End: 2021-10-13
  Administered 2021-10-11 – 2021-10-12 (×2): 50 mg via ORAL
  Filled 2021-10-11 (×2): qty 1

## 2021-10-11 MED ORDER — AMISULPRIDE (ANTIEMETIC) 5 MG/2ML IV SOLN: 10.0000 mg | Freq: Once | INTRAVENOUS | Status: DC | PRN

## 2021-10-11 MED ORDER — PANTOPRAZOLE SODIUM 40 MG IV SOLR: 40.0000 mg | Freq: Every day | INTRAVENOUS | Status: DC

## 2021-10-11 MED ORDER — GABAPENTIN 100 MG PO CAPS
200.0000 mg | ORAL_CAPSULE | Freq: Two times a day (BID) | ORAL | Status: DC
  Administered 2021-10-11 – 2021-10-13 (×3): 200 mg via ORAL
  Filled 2021-10-11 (×5): qty 2

## 2021-10-11 MED ORDER — OXYCODONE HCL 5 MG/5ML PO SOLN
5.0000 mg | Freq: Four times a day (QID) | ORAL | Status: DC | PRN
  Administered 2021-10-11 – 2021-10-13 (×2): 5 mg via ORAL
  Filled 2021-10-11 (×2): qty 5

## 2021-10-11 MED ORDER — LIDOCAINE HCL (PF) 2 % IJ SOLN
INTRAMUSCULAR | Status: AC
  Filled 2021-10-11: qty 5

## 2021-10-11 MED ORDER — VISTASEAL 4 ML SINGLE DOSE KIT
4.0000 mL | PACK | Freq: Once | CUTANEOUS | Status: AC
Start: 2021-10-11 — End: 2021-10-11
  Administered 2021-10-11: 4 mL via TOPICAL
  Filled 2021-10-11: qty 4

## 2021-10-11 MED ORDER — DEXAMETHASONE SODIUM PHOSPHATE 10 MG/ML IJ SOLN
INTRAMUSCULAR | Status: AC
  Filled 2021-10-11: qty 1

## 2021-10-11 MED ORDER — BUPIVACAINE-EPINEPHRINE (PF) 0.25% -1:200000 IJ SOLN
INTRAMUSCULAR | Status: AC
  Filled 2021-10-11: qty 30

## 2021-10-11 MED ORDER — LIDOCAINE 2% (20 MG/ML) 5 ML SYRINGE
INTRAMUSCULAR | Status: DC | PRN
  Administered 2021-10-11: 100 mg via INTRAVENOUS

## 2021-10-11 MED ORDER — PROPOFOL 10 MG/ML IV BOLUS
INTRAVENOUS | Status: DC | PRN
  Administered 2021-10-11: 100 mg via INTRAVENOUS
  Administered 2021-10-11: 200 mg via INTRAVENOUS

## 2021-10-11 MED ORDER — METOPROLOL TARTRATE 5 MG/5ML IV SOLN
5.0000 mg | Freq: Four times a day (QID) | INTRAVENOUS | Status: DC | PRN
  Filled 2021-10-11: qty 5

## 2021-10-11 MED ORDER — OXYCODONE HCL 5 MG PO TABS: 5.0000 mg | ORAL_TABLET | Freq: Once | ORAL | Status: DC | PRN

## 2021-10-11 MED ORDER — HYDRALAZINE HCL 20 MG/ML IJ SOLN
10.0000 mg | INTRAMUSCULAR | Status: DC | PRN
  Filled 2021-10-11: qty 0.5

## 2021-10-11 MED ORDER — GABAPENTIN 100 MG PO CAPS: 200.0000 mg | ORAL_CAPSULE | Freq: Two times a day (BID) | ORAL | Status: DC

## 2021-10-11 MED ORDER — ACETAMINOPHEN 500 MG PO TABS
1000.0000 mg | ORAL_TABLET | ORAL | Status: AC
  Administered 2021-10-11: 1000 mg via ORAL
  Filled 2021-10-11: qty 2

## 2021-10-11 MED ORDER — ONDANSETRON HCL 4 MG/2ML IJ SOLN
INTRAMUSCULAR | Status: DC | PRN
  Administered 2021-10-11: 4 mg via INTRAVENOUS

## 2021-10-11 MED ORDER — OXYCODONE HCL 5 MG/5ML PO SOLN: 5.0000 mg | Freq: Once | ORAL | Status: DC | PRN

## 2021-10-11 MED ORDER — TRAMADOL HCL 50 MG PO TABS: 50.0000 mg | ORAL_TABLET | Freq: Four times a day (QID) | ORAL | Status: DC | PRN

## 2021-10-11 MED ORDER — DOCUSATE SODIUM 100 MG PO CAPS
100.0000 mg | ORAL_CAPSULE | Freq: Two times a day (BID) | ORAL | Status: DC
  Administered 2021-10-11 – 2021-10-13 (×3): 100 mg via ORAL
  Filled 2021-10-11 (×5): qty 1

## 2021-10-11 MED ORDER — SCOPOLAMINE 1 MG/3DAYS TD PT72
1.0000 | MEDICATED_PATCH | TRANSDERMAL | Status: DC
  Administered 2021-10-11: 1.5 mg via TRANSDERMAL
  Filled 2021-10-11: qty 1

## 2021-10-11 MED ORDER — SODIUM CHLORIDE 0.9 % IV SOLN
2.0000 g | INTRAVENOUS | Status: AC
  Administered 2021-10-11: 2 g via INTRAVENOUS
  Filled 2021-10-11: qty 2

## 2021-10-11 SURGICAL SUPPLY — 83 items
APPLICATOR COTTON TIP 6 STRL (MISCELLANEOUS) IMPLANT
APPLICATOR COTTON TIP 6IN STRL (MISCELLANEOUS)
APPLICATOR VISTASEAL 35 (MISCELLANEOUS) ×2 IMPLANT
APPLIER CLIP ROT 13.4 12 LRG (CLIP)
BAG COUNTER SPONGE SURGICOUNT (BAG) IMPLANT
BENZOIN TINCTURE PRP APPL 2/3 (GAUZE/BANDAGES/DRESSINGS) ×2 IMPLANT
BLADE SURG SZ11 CARB STEEL (BLADE) ×2 IMPLANT
BNDG ADH 1X3 SHEER STRL LF (GAUZE/BANDAGES/DRESSINGS) ×12 IMPLANT
CABLE HIGH FREQUENCY MONO STRZ (ELECTRODE) IMPLANT
CHLORAPREP W/TINT 26 (MISCELLANEOUS) ×4 IMPLANT
CLIP APPLIE ROT 13.4 12 LRG (CLIP) IMPLANT
CLIP SUT LAPRA TY ABSORB (SUTURE) ×4 IMPLANT
COVER SURGICAL LIGHT HANDLE (MISCELLANEOUS) ×2 IMPLANT
DEVICE SUT QUICK LOAD TK 5 (STAPLE) ×2 IMPLANT
DEVICE SUT TI-KNOT TK 5X26 (MISCELLANEOUS) ×1 IMPLANT
DEVICE SUTURE ENDOST 10MM (ENDOMECHANICALS) ×2 IMPLANT
DRAIN PENROSE 0.25X18 (DRAIN) ×2 IMPLANT
ELECT REM PT RETURN 15FT ADLT (MISCELLANEOUS) ×2 IMPLANT
GAUZE 4X4 16PLY ~~LOC~~+RFID DBL (SPONGE) ×2 IMPLANT
GAUZE SPONGE 4X4 12PLY STRL (GAUZE/BANDAGES/DRESSINGS) IMPLANT
GLOVE BIO SURGEON STRL SZ 6 (GLOVE) ×2 IMPLANT
GLOVE INDICATOR 6.5 STRL GRN (GLOVE) ×2 IMPLANT
GLOVE SS BIOGEL STRL SZ 6 (GLOVE) ×1 IMPLANT
GLOVE SUPERSENSE BIOGEL SZ 6 (GLOVE) ×1
GOWN STRL REUS W/ TWL LRG LVL3 (GOWN DISPOSABLE) ×1 IMPLANT
GOWN STRL REUS W/ TWL XL LVL3 (GOWN DISPOSABLE) IMPLANT
GOWN STRL REUS W/TWL LRG LVL3 (GOWN DISPOSABLE) ×2
GOWN STRL REUS W/TWL XL LVL3 (GOWN DISPOSABLE)
IRRIG SUCT STRYKERFLOW 2 WTIP (MISCELLANEOUS) ×2
IRRIGATION SUCT STRKRFLW 2 WTP (MISCELLANEOUS) ×1 IMPLANT
KIT BASIN OR (CUSTOM PROCEDURE TRAY) ×2 IMPLANT
KIT GASTRIC LAVAGE 34FR ADT (SET/KITS/TRAYS/PACK) ×2 IMPLANT
KIT TURNOVER KIT A (KITS) IMPLANT
MARKER SKIN DUAL TIP RULER LAB (MISCELLANEOUS) ×2 IMPLANT
MAT PREVALON FULL STRYKER (MISCELLANEOUS) ×2 IMPLANT
NDL SPNL 22GX3.5 QUINCKE BK (NEEDLE) ×1 IMPLANT
NEEDLE SPNL 22GX3.5 QUINCKE BK (NEEDLE) ×2 IMPLANT
PACK CARDIOVASCULAR III (CUSTOM PROCEDURE TRAY) ×2 IMPLANT
PENCIL SMOKE EVACUATOR (MISCELLANEOUS) IMPLANT
RELOAD ENDO STITCH 2.0 (ENDOMECHANICALS) ×20
RELOAD STAPLE 60 2.6 WHT THN (STAPLE) ×2 IMPLANT
RELOAD STAPLE 60 3.6 BLU REG (STAPLE) ×2 IMPLANT
RELOAD STAPLE 60 3.8 GOLD REG (STAPLE) IMPLANT
RELOAD STAPLE 60 4.1 GRN THCK (STAPLE) ×1 IMPLANT
RELOAD STAPLER BLUE 60MM (STAPLE) ×4 IMPLANT
RELOAD STAPLER GOLD 60MM (STAPLE) ×1 IMPLANT
RELOAD STAPLER GREEN 60MM (STAPLE) ×1 IMPLANT
RELOAD STAPLER WHITE 60MM (STAPLE) ×2 IMPLANT
RELOAD SUT SNGL STCH ABSRB 2-0 (ENDOMECHANICALS) ×4 IMPLANT
RELOAD SUT SNGL STCH BLK 2-0 (ENDOMECHANICALS) ×4 IMPLANT
SCISSORS LAP 5X45 EPIX DISP (ENDOMECHANICALS) ×2 IMPLANT
SET TUBE SMOKE EVAC HIGH FLOW (TUBING) ×2 IMPLANT
SHEARS HARMONIC ACE PLUS 45CM (MISCELLANEOUS) ×2 IMPLANT
SLEEVE XCEL OPT CAN 5 100 (ENDOMECHANICALS) ×4 IMPLANT
SLEEVE Z-THREAD 5X100MM (TROCAR) ×2 IMPLANT
SOL ANTI FOG 6CC (MISCELLANEOUS) ×1 IMPLANT
SOLUTION ANTI FOG 6CC (MISCELLANEOUS) ×1
STAPLER ECHELON BIOABSB 60 FLE (MISCELLANEOUS) IMPLANT
STAPLER ECHELON LONG 60 440 (INSTRUMENTS) ×2 IMPLANT
STAPLER RELOAD BLUE 60MM (STAPLE) ×8
STAPLER RELOAD GOLD 60MM (STAPLE) ×2
STAPLER RELOAD GREEN 60MM (STAPLE) ×2
STAPLER RELOAD WHITE 60MM (STAPLE) ×4
STRIP CLOSURE SKIN 1/2X4 (GAUZE/BANDAGES/DRESSINGS) ×2 IMPLANT
SUT MNCRL AB 4-0 PS2 18 (SUTURE) ×2 IMPLANT
SUT RELOAD ENDO STITCH 2 48X1 (ENDOMECHANICALS) ×4
SUT RELOAD ENDO STITCH 2.0 (ENDOMECHANICALS) ×6
SUT SURGIDAC NAB ES-9 0 48 120 (SUTURE) ×2 IMPLANT
SUT VIC AB 2-0 SH 27 (SUTURE) ×2
SUT VIC AB 2-0 SH 27X BRD (SUTURE) ×1 IMPLANT
SUTURE RELOAD END STTCH 2 48X1 (ENDOMECHANICALS) ×4 IMPLANT
SUTURE RELOAD ENDO STITCH 2.0 (ENDOMECHANICALS) ×6 IMPLANT
SYR 10ML ECCENTRIC (SYRINGE) ×2 IMPLANT
SYR 20ML LL LF (SYRINGE) ×4 IMPLANT
TOWEL OR 17X26 10 PK STRL BLUE (TOWEL DISPOSABLE) ×2 IMPLANT
TOWEL OR NON WOVEN STRL DISP B (DISPOSABLE) ×2 IMPLANT
TRAY FOLEY MTR SLVR 16FR STAT (SET/KITS/TRAYS/PACK) ×2 IMPLANT
TROCAR BLADELESS OPT 5 100 (ENDOMECHANICALS) ×2 IMPLANT
TROCAR UNIVERSAL OPT 12M 100M (ENDOMECHANICALS) ×2 IMPLANT
TROCAR XCEL 12X100 BLDLESS (ENDOMECHANICALS) ×2 IMPLANT
TROCAR Z-THREAD FIOS 12X100MM (TROCAR) ×1 IMPLANT
TROCAR Z-THREAD FIOS 5X100MM (TROCAR) ×1 IMPLANT
TUBING CONNECTING 10 (TUBING) IMPLANT

## 2021-10-11 NOTE — Anesthesia Preprocedure Evaluation (Addendum)
Anesthesia Evaluation  ?Patient identified by MRN, date of birth, ID band ?Patient awake ? ? ? ?Reviewed: ?Allergy & Precautions, NPO status , Patient's Chart, lab work & pertinent test results, reviewed documented beta blocker date and time  ? ?Airway ?Mallampati: III ? ?TM Distance: >3 FB ?Neck ROM: Full ? ? ? Dental ?no notable dental hx. ?(+) Teeth Intact, Dental Advisory Given ?  ?Pulmonary ? ?Snores ? ?  ?Pulmonary exam normal ?breath sounds clear to auscultation ? ? ? ? ? ? Cardiovascular ?negative cardio ROS ?Normal cardiovascular exam ?Rhythm:Regular Rate:Normal ? ? ?  ?Neuro/Psych ? Headaches, Hx/o Chiari I malformation minimal herniation, no surgery recommended ?negative psych ROS  ? GI/Hepatic ?Neg liver ROS, hiatal hernia, GERD  Medicated and Controlled,  ?Endo/Other  ?Hypothyroidism Morbid obesityHyperlipidemia ?PCOS ? Renal/GU ?negative Renal ROS  ?negative genitourinary ?  ?Musculoskeletal ?Eczema  ? Abdominal ?(+) + obese,   ?Peds ? Hematology ? ?(+) Blood dyscrasia, anemia ,   ?Anesthesia Other Findings ? ? Reproductive/Obstetrics ? ?  ? ? ? ? ? ? ? ? ? ? ? ? ? ?  ?  ? ? ? ? ? ? ? ?Anesthesia Physical ?Anesthesia Plan ? ?ASA: 3 ? ?Anesthesia Plan: General  ? ?Post-op Pain Management: Precedex, Tylenol PO (pre-op)* and Ketamine IV*  ? ?Induction: Intravenous and Cricoid pressure planned ? ?PONV Risk Score and Plan: 4 or greater and Treatment may vary due to age or medical condition, Scopolamine patch - Pre-op, Midazolam, Ondansetron and Dexamethasone ? ?Airway Management Planned: Oral ETT ? ?Additional Equipment: None ? ?Intra-op Plan:  ? ?Post-operative Plan: Extubation in OR ? ?Informed Consent: I have reviewed the patients History and Physical, chart, labs and discussed the procedure including the risks, benefits and alternatives for the proposed anesthesia with the patient or authorized representative who has indicated his/her understanding and acceptance.   ? ? ? ?Dental advisory given ? ?Plan Discussed with: CRNA and Anesthesiologist ? ?Anesthesia Plan Comments:   ? ? ? ? ? ? ?Anesthesia Quick Evaluation ? ?

## 2021-10-11 NOTE — Anesthesia Procedure Notes (Addendum)
Procedure Name: Intubation ?Date/Time: 10/11/2021 11:03 AM ?Performed by: Milford Cage, CRNA ?Pre-anesthesia Checklist: Patient identified, Emergency Drugs available, Suction available and Patient being monitored ?Patient Re-evaluated:Patient Re-evaluated prior to induction ?Oxygen Delivery Method: Circle system utilized ?Preoxygenation: Pre-oxygenation with 100% oxygen ?Induction Type: IV induction ?Ventilation: Mask ventilation without difficulty ?Laryngoscope Size: Glidescope and 3 ?Grade View: Grade I ?Tube type: Oral ?Tube size: 7.0 mm ?Number of attempts: 2 ?Airway Equipment and Method: Stylet ?Placement Confirmation: ETT inserted through vocal cords under direct vision, positive ETCO2 and breath sounds checked- equal and bilateral ?Tube secured with: Tape ?Dental Injury: Teeth and Oropharynx as per pre-operative assessment  ?Difficulty Due To: Difficulty was unanticipated, Difficult Airway-  due to edematous airway and Difficult Airway- due to limited oral opening ?Future Recommendations: Recommend- induction with short-acting agent, and alternative techniques readily available ?Comments: Airway performed by Despina Arias EMT student and Dr. Royce Macadamia ? ? ? ? ?

## 2021-10-11 NOTE — Op Note (Signed)
Operative Note ? ?Wendy Watkins  ?[id number]  ?[id number]  ?10/11/2021 ? ? ?Surgeon: Phylliss Blakes MD FACS ?  ?Assistant: Feliciana Rossetti MD FACS ?  ?Procedure performed: laparoscopic Roux-en-Y gastric bypass (antecolic, antegastric), hiatal hernia repair (full dissection), upper endoscopy ?  ?Preop diagnosis: Morbid obesity Body mass index is 46.29 kg/m?., reflux, hiatal hernia ?Post-op diagnosis/intraop findings: same ?  ?Specimens: none ?Retained items: none  ?EBL: 30 cc ?Complications: none ?  ?Description of procedure: After obtaining informed consent and administration of prophylactic heparin in holding, the patient was taken to the operating room and placed supine on operating room table where general endotracheal anesthesia was initiated, preoperative antibiotics were administered, SCDs applied, and a formal timeout was performed. Foley was inserted which is removed at the end of the case. The abdomen was prepped and draped in usual sterile fashion. Peritoneal access was gained using a Visiport technique in the left upper quadrant and insufflation to 15 mmHg ensued without issue. Gross inspection revealed no evidence of injury. Under direct visualization the remaining trochars were inserted. A laparoscopic assisted bilateral taps block was performed using Exparel mixed with Marcaine.  ?The omentum was reflected cephalad and the ligament of Treitz identified. The small bowel was followed to a point 50 cm distal to ligament of Treitz at which location the bowel was divided with a white load linear cutting stapler. A Penrose was sutured to the Roux side of the staple line for future identification. The bowel was measured another 100 cm distal to this and and the site for the jejunojejunostomy was aligned with the end of the biliopancreatic limb. Enterotomies were made with the Harmonic scalpel and the anastomosis was created with the 60 mm white load linear cutting stapler. The common enterotomy was closed with  running 3-0 Vicryl starting on either end and tying centrally. The mesenteric defect was closed with running silk suture secured with Lapra-Ty's. The anastomosis was inspected and appeared widely patent, hemostatic with no gaps in the suture line. Vistaseal was injected over the anastomosis. We then divided the omentum using the harmonic scalpel.  ?The patient was then placed in steep reverse Trendelenburg. The liver retractor was inserted through a subxiphoid incision and secured for fixed retraction of the left lobe.  There was a moderate hiatal hernia.  The pars flaccida was entered and the crura were dissected circumferentially, starting on the right side, proceeding anteriorly and crossing over to the left side, and then returning to the posterior aspect ensuring that the esophagus was well mobilized into the abdominal cavity with at least 3 cm of intra-abdominal esophagus and complete reduction of the small hernia sac.  The hiatus narrowed with 2 posterior simple interrupted 0 Ethibonds secured with ty-knots.   ?The Harmonic scalpel was used to enter the perigastric plane and the lesser sac at a point 5 cm distal to the GE junction on the lesser curve. The angle of His was gently bluntly dissected in the target shape of the pouch visualized to exclude any residual fundus. After confirming that all tubes have been removed from the stomach, the gastric pouch was created with serial fires of the linear cutting stapler. As we approached the angle of His, the Ewald tube was inserted to confirm no impingement on the GE junction. The Roux limb with its attached Penrose drain was then identified and brought up to meet the gastric pouch ensuring no twist in the small bowel mesentery. The staple line of the small bowel is directed to the  patient's left side. A running 3-0 Vicryl was used to create a posterior suture line for our anastomosis between the gastric pouch and the small bowel. Gastrotomy and enterotomy was  made with the Harmonic scalpel and a blue load linear cutting stapler was used to create a gastrojejunal anastomosis approximately 2.5cm wide. The common enterotomy was closed with running 3-0 Vicryl starting at either end and tying centrally. At this juncture the Ewald tube was passed through the gastrojejunal anastomosis. An anterior layer of running 3-0 Vicryl was used to complete the gastrojejunal anastomosis. The ewald tube was removed without difficulty. The Cambria space was closed with a figure-of-eight silk suture.  ?At this point the assistant performed an upper endoscopy with the Roux limb gently clamped with a bowel clamp. Irrigation is instilled in the upper abdomen for a leak test. Please see his separate operative note- the anastomosis is noted to be patent and hemostatic without any leak or bubbles present. The endoscope was removed and the abdomen once again surveyed.  Hemostasis was confirmed.  Vistaseal was sprayed over the gastrojejunal anastomosis.  The liver retractor was removed under direct visualization. The abdomen was then desufflated and all remaining trochars removed. The skin incisions were closed with running subcuticular 4-0 Monocryl; benzoin, Steri-Strips and Band-Aids were applied. The patient was then awakened, extubated and taken to PACU in stable condition.   ?  ?All counts were correct at the completion of the case.  ?  ? ?

## 2021-10-11 NOTE — Anesthesia Postprocedure Evaluation (Signed)
Anesthesia Post Note ? ?Patient: Wendy Watkins ? ?Procedure(s) Performed: LAPAROSCOPIC ROUX-EN-Y GASTRIC BYPASS WITH UPPER ENDOSCOPY ?UPPER GI ENDOSCOPY ?HERNIA REPAIR HIATAL ? ?  ? ?Patient location during evaluation: PACU ?Anesthesia Type: General ?Level of consciousness: awake and alert and oriented ?Pain management: pain level controlled ?Vital Signs Assessment: post-procedure vital signs reviewed and stable ?Respiratory status: spontaneous breathing, nonlabored ventilation and respiratory function stable ?Cardiovascular status: blood pressure returned to baseline and stable ?Postop Assessment: no apparent nausea or vomiting ?Anesthetic complications: no ? ? ?No notable events documented. ? ?Last Vitals:  ?Vitals:  ? 10/11/21 1430 10/11/21 1500  ?BP: 112/70 107/67  ?Pulse: 66 60  ?Resp: 15 17  ?Temp:  (!) 36.4 ?C  ?SpO2: 95% 97%  ?  ?Last Pain:  ?Vitals:  ? 10/11/21 1500  ?TempSrc: Oral  ?PainSc: 6   ? ? ?  ?  ?  ?  ?  ?  ? ?Congetta Odriscoll A. ? ? ? ? ?

## 2021-10-11 NOTE — Transfer of Care (Signed)
Immediate Anesthesia Transfer of Care Note ? ?Patient: Wendy Watkins ? ?Procedure(s) Performed: LAPAROSCOPIC ROUX-EN-Y GASTRIC BYPASS WITH UPPER ENDOSCOPY ?UPPER GI ENDOSCOPY ?HERNIA REPAIR HIATAL ? ?Patient Location: PACU ? ?Anesthesia Type:General ? ?Level of Consciousness: awake ? ?Airway & Oxygen Therapy: Patient Spontanous Breathing and Patient connected to face mask oxygen ? ?Post-op Assessment: Report given to RN and Post -op Vital signs reviewed and stable ? ?Post vital signs: Reviewed and stable ? ?Last Vitals:  ?Vitals Value Taken Time  ?BP 132/80 10/11/21 1356  ?Temp    ?Pulse 87 10/11/21 1357  ?Resp 20 10/11/21 1357  ?SpO2 100 % 10/11/21 1357  ?Vitals shown include unvalidated device data. ? ?Last Pain:  ?Vitals:  ? 10/11/21 0900  ?TempSrc:   ?PainSc: 0-No pain  ?   ? ?  ? ?Complications: No notable events documented. ?

## 2021-10-11 NOTE — Progress Notes (Signed)
PHARMACY CONSULT FOR:  Risk Assessment for Post-Discharge VTE Following Bariatric Surgery ? ?Post-Discharge VTE Risk Assessment: ?This patient's probability of 30-day post-discharge VTE is increased due to the factors marked: ? Sleeve gastrectomy  ? Liver disorder (transplant, cirrhosis, or nonalcoholic steatohepatitis)  ? Hx of VTE  ? Hemorrhage requiring transfusion  ? GI perforation, leak, or obstruction  ? ====================================================  ?  Female  ?  Age >/=60 years  ?  BMI >/=50 kg/m2  ?  CHF  ?  Dyspnea at Rest  ?  Paraplegia  ?  Non-gastric-band surgery  ?  Operation Time >/=3 hr  ?  Return to OR   ?  Length of Stay >/= 3 d  ? Hypercoagulable condition  ? Significant venous stasis  ? ? ? ? ?Predicted probability of 30-day post-discharge VTE: 0.16% ? ?Other patient-specific factors to consider: N/A ? ? ?Recommendation for Discharge: ?No pharmacologic prophylaxis post-discharge ? ? ? ?Wendy Watkins is a 46 y.o. female who underwent laparoscopic Roux-en-Y gastric bypass on 10/11/21 ?  ?Case start: 1128 ?Case end: 1342 ? ? ?Allergies  ?Allergen Reactions  ? Other Anaphylaxis  ?  Peaches: throat swelling  ? ? ?Patient Measurements: ?Height: 5\' 1"  (154.9 cm) ?Weight: 111.1 kg (245 lb) ?IBW/kg (Calculated) : 47.8 ?Body mass index is 46.29 kg/m?. ? ?No results for input(s): WBC, HGB, HCT, PLT, APTT, CREATININE, LABCREA, CREATININE, CREAT24HRUR, MG, PHOS, ALBUMIN, PROT, ALBUMIN, AST, ALT, ALKPHOS, BILITOT, BILIDIR, IBILI in the last 72 hours. ?Estimated Creatinine Clearance: 89.1 mL/min (by C-G formula based on SCr of 0.92 mg/dL). ? ? ? ?Past Medical History:  ?Diagnosis Date  ? Chiari I malformation (HCC)   ? Eczema   ? GERD (gastroesophageal reflux disease)   ? Heavy menstrual period   ? History of iron deficiency anemia   ? Hyperlipidemia   ? Hypothyroidism   ? Obesity   ? PCOS (polycystic ovarian syndrome)   ? PMS (premenstrual syndrome) 10/11/2016  ? Urge incontinence   ? Vitamin D  deficiency   ? Wears contact lenses   ? Wears glasses   ? Wears glasses   ? ? ? ?Facility-Administered Medications Prior to Admission  ?Medication Dose Route Frequency Provider Last Rate Last Admin  ? fluconazole (DIFLUCAN) tablet 150 mg  150 mg Oral Once 12/11/2016, PA-C      ? ?Medications Prior to Admission  ?Medication Sig Dispense Refill Last Dose  ? Cholecalciferol (VITAMIN D3) 250 MCG (10000 UT) TABS Take 10,000 Units by mouth daily.    10/10/2021  ? clobetasol ointment (TEMOVATE) 0.05 % APPLY OINTMENT TOPICALLY TO SKIN TWICE DAILY FOR 14 DAYS (Patient taking differently: Apply 1 application. topically daily as needed (eczema).) 30 g 0 Past Week  ? esomeprazole (NEXIUM) 20 MG capsule Take 20 mg by mouth daily as needed (acid reflux).   10/11/2021 at 0730  ? FLUoxetine (PROZAC) 20 MG capsule Take 1 capsule by mouth once daily 30 capsule 3 10/10/2021  ? levonorgestrel (MIRENA) 20 MCG/24HR IUD by Intrauterine route.   in place  ? NP THYROID 120 MG tablet Take 1 tablet (120 mg total) by mouth 2 (two) times daily. 60 tablet 3 10/10/2021  ? nitrofurantoin, macrocrystal-monohydrate, (MACROBID) 100 MG capsule Take 1 capsule (100 mg total) by mouth 2 (two) times daily. (Patient not taking: Reported on 09/21/2021) 14 capsule 0 Not Taking  ? ? ? ?09/23/2021, PharmD, BCPS ?Clinical Pharmacist ?10/11/2021 3:19 PM ? ?

## 2021-10-11 NOTE — Interval H&P Note (Signed)
History and Physical Interval Note: ? ?10/11/2021 ?10:03 AM ? ?Wendy Watkins  has presented today for surgery, with the diagnosis of MORBID OBESITY.  The various methods of treatment have been discussed with the patient and family. After consideration of risks, benefits and other options for treatment, the patient has consented to  Procedure(s): ?LAPAROSCOPIC ROUX-EN-Y GASTRIC BYPASS WITH UPPER ENDOSCOPY (N/A) ?UPPER GI ENDOSCOPY (N/A) ?HERNIA REPAIR HIATAL (N/A) as a surgical intervention.  The patient's history has been reviewed, patient examined, no change in status, stable for surgery.  I have reviewed the patient's chart and labs.  Questions were answered to the patient's satisfaction.   ? ? ?Zimere Dunlevy Lollie Sails ? ? ?

## 2021-10-11 NOTE — Op Note (Signed)
Preoperative diagnosis: Roux-en-Y gastric bypass ? ?Postoperative diagnosis: Same  ? ?Procedure: Upper endoscopy  ? ?Surgeon: Feliciana Rossetti, M.D. ? ?Anesthesia: Gen.  ? ?Indications for procedure: This patient was undergoing a Roux-en-Y gastric bypass.  ? ?Description of procedure: The endoscopy was placed in the mouth and into the oropharynx and under endoscopic vision it was advanced to the esophagogastric junction.  The stomach was insufflated and no bleeding or bubbles were seen.  The GEJ was identified at 37 cm from the teeth. The anastomosis was widely patent at 43 cm. No bleeding or leaks were detected. The scope was withdrawn without difficulty.   ? ?Feliciana Rossetti, M.D. ?General, Bariatric, & Minimally Invasive Surgery ?[facility] ? ? ?

## 2021-10-12 ENCOUNTER — Encounter (HOSPITAL_COMMUNITY): Payer: Self-pay | Admitting: Surgery

## 2021-10-12 ENCOUNTER — Other Ambulatory Visit (HOSPITAL_COMMUNITY): Payer: Self-pay

## 2021-10-12 LAB — CBC WITH DIFFERENTIAL/PLATELET
Abs Immature Granulocytes: 0.09 10*3/uL — ABNORMAL HIGH (ref 0.00–0.07)
Basophils Absolute: 0 10*3/uL (ref 0.0–0.1)
Basophils Relative: 0 %
Eosinophils Absolute: 0 10*3/uL (ref 0.0–0.5)
Eosinophils Relative: 0 %
HCT: 36.3 % (ref 36.0–46.0)
Hemoglobin: 11.7 g/dL — ABNORMAL LOW (ref 12.0–15.0)
Immature Granulocytes: 0 %
Lymphocytes Relative: 6 %
Lymphs Abs: 1.3 10*3/uL (ref 0.7–4.0)
MCH: 30.3 pg (ref 26.0–34.0)
MCHC: 32.2 g/dL (ref 30.0–36.0)
MCV: 94 fL (ref 80.0–100.0)
Monocytes Absolute: 0.6 10*3/uL (ref 0.1–1.0)
Monocytes Relative: 3 %
Neutro Abs: 18.1 10*3/uL — ABNORMAL HIGH (ref 1.7–7.7)
Neutrophils Relative %: 91 %
Platelets: 395 10*3/uL (ref 150–400)
RBC: 3.86 MIL/uL — ABNORMAL LOW (ref 3.87–5.11)
RDW: 13.1 % (ref 11.5–15.5)
WBC: 20.2 10*3/uL — ABNORMAL HIGH (ref 4.0–10.5)
nRBC: 0 % (ref 0.0–0.2)

## 2021-10-12 LAB — COMPREHENSIVE METABOLIC PANEL
ALT: 21 U/L (ref 0–44)
AST: 28 U/L (ref 15–41)
Albumin: 3.6 g/dL (ref 3.5–5.0)
Alkaline Phosphatase: 96 U/L (ref 38–126)
Anion gap: 6 (ref 5–15)
BUN: 13 mg/dL (ref 6–20)
CO2: 24 mmol/L (ref 22–32)
Calcium: 8.9 mg/dL (ref 8.9–10.3)
Chloride: 105 mmol/L (ref 98–111)
Creatinine, Ser: 0.83 mg/dL (ref 0.44–1.00)
GFR, Estimated: >60 mL/min (ref >60)
Glucose, Bld: 127 mg/dL — ABNORMAL HIGH (ref 70–99)
Potassium: 5.2 mmol/L — ABNORMAL HIGH (ref 3.5–5.1)
Sodium: 135 mmol/L (ref 135–145)
Total Bilirubin: 0.5 mg/dL (ref 0.3–1.2)
Total Protein: 7.3 g/dL (ref 6.5–8.1)

## 2021-10-12 LAB — MAGNESIUM: Magnesium: 2.2 mg/dL (ref 1.7–2.4)

## 2021-10-12 MED ORDER — ONDANSETRON 4 MG PO TBDP
4.0000 mg | ORAL_TABLET | Freq: Four times a day (QID) | ORAL | 0 refills | Status: DC | PRN
  Filled 2021-10-12: qty 9, 18d supply, fill #0

## 2021-10-12 MED ORDER — GABAPENTIN 100 MG PO CAPS
200.0000 mg | ORAL_CAPSULE | Freq: Two times a day (BID) | ORAL | 0 refills | Status: DC
  Filled 2021-10-12: qty 20, 5d supply, fill #0

## 2021-10-12 MED ORDER — ACETAMINOPHEN 500 MG PO TABS: 1000.0000 mg | ORAL_TABLET | Freq: Three times a day (TID) | ORAL | 0 refills | Status: AC

## 2021-10-12 MED ORDER — THYROID 120 MG PO TABS
120.0000 mg | ORAL_TABLET | Freq: Two times a day (BID) | ORAL | Status: DC
Start: 2021-10-12 — End: 2021-10-12
  Filled 2021-10-12: qty 1

## 2021-10-12 MED ORDER — THYROID 60 MG PO TABS
120.0000 mg | ORAL_TABLET | Freq: Two times a day (BID) | ORAL | Status: DC
  Administered 2021-10-12 – 2021-10-13 (×3): 120 mg via ORAL
  Filled 2021-10-12 (×4): qty 2

## 2021-10-12 MED ORDER — PANTOPRAZOLE SODIUM 40 MG PO TBEC
40.0000 mg | DELAYED_RELEASE_TABLET | Freq: Every day | ORAL | 0 refills | Status: DC
  Filled 2021-10-12: qty 90, 90d supply, fill #0

## 2021-10-12 MED ORDER — ENOXAPARIN SODIUM 30 MG/0.3ML IJ SOSY
30.0000 mg | PREFILLED_SYRINGE | Freq: Two times a day (BID) | INTRAMUSCULAR | Status: DC
  Administered 2021-10-12 (×2): 30 mg via SUBCUTANEOUS
  Filled 2021-10-12 (×2): qty 0.3

## 2021-10-12 MED ORDER — TRAMADOL HCL 50 MG PO TABS
50.0000 mg | ORAL_TABLET | Freq: Four times a day (QID) | ORAL | 0 refills | Status: DC | PRN
  Filled 2021-10-12: qty 10, 3d supply, fill #0

## 2021-10-12 NOTE — Progress Notes (Signed)
S: Had a decent night.  Denies significant pain or nausea.  She is working slowly but surely slightly liquids, and is on her fifth cup of water this morning.  No dysphagia.  Walked once yesterday evening but states she got very tired and had to return to the room before completing the whole lap ? ?O: ?Vitals, labs, intake/output, and orders reviewed at this time. Afebrile, HR 59-87, normotensive, sats 99% room air. PO 180. UOP 650 + 2x. CMP- potassium 5.2 otherwise unremarkable; Cr 0.83. WBC 20.2 (8.1 preop), Hgb 11.7 (12.4 preop), plt 395 (451) ? ? ?Gen: A&Ox3, no distress  ?H&N: EOMI, atraumatic, neck supple ?Chest: unlabored respirations, RRR ?Abd: soft, completely nontender, nondistended, incision(s) c/d/i without cellulitis or hematoma ?Ext: warm, no edema ?Neuro: grossly normal ? ?Lines/tubes/drains: PIV ? ?A/P: POD 1 s/p laparoscopic roux en y gastric bypass with hiatal hernia repair  ?-Continue clear liquids and protein shakes ?-Continue ambulation, SCDs while in bed, prophylactic Lovenox, pulmonary toilet ?-Continue to monitor today, potential discharge this evening if continuing to do well ? ?Phylliss Blakes, MD FACS ?[facility] ? ?  ?

## 2021-10-12 NOTE — Progress Notes (Signed)

## 2021-10-12 NOTE — Discharge Instructions (Signed)
GASTRIC BYPASS / SLEEVE  ?Home Care Instructions ? ?These instructions are to help you care for yourself when you go home. ? ?Call: If you have any problems. ?Call [phone] and ask for the surgeon on call ?If you have an emergency related to your surgery please use the ER at [place].  ?Tell the ER staff that you are a new post-op gastric bypass or gastric sleeve patient ?  ?Signs and symptoms to report: Severe vomiting or nausea ?If you cannot handle clear liquids for longer than 1 day, call your surgeon  ?Abdominal pain which does not get better after taking your pain medication ?Fever greater than 100.4? F and chills ?Heart rate over 100 beats a minute ?Trouble breathing ?Chest pain ? Redness, swelling, drainage, or foul odor at incision (surgical) sites ? If your incisions open or pull apart ?Swelling or pain in calf (lower leg) ?Diarrhea (Loose bowel movements that happen often), frequent watery, uncontrolled bowel movements ?Constipation, (no bowel movements for 3 days) if this happens:  ?Take Milk of Magnesia, 2 tablespoons by mouth, 3 times a day for 2 days if needed ?Stop taking Milk of Magnesia once you have had a bowel movement ?Call your doctor if constipation continues ?Or ?Take Miralax  (instead of Milk of Magnesia) following the label instructions ?Stop taking Miralax once you have had a bowel movement ?Call your doctor if constipation continues ?Anything you think is ?abnormal for you? ?  ?Normal side effects after surgery: Unable to sleep at night or unable to concentrate ?Irritability ?Being tearful (crying) or depressed ?These are common complaints, possibly related to your anesthesia, stress of surgery and change in lifestyle, that usually go away a few weeks after surgery.  If these feelings continue, call your medical doctor.  ?Wound Care: You may have surgical glue, steri-strips, or staples over your incisions after surgery ?Surgical glue:  Looks like a clear film over your incisions  and will wear off a little at a time ?Steri-strips : Adhesive strips of tape over your incisions. You may notice a yellowish color on the skin under the steri-strips. This is used to make the   steri-strips stick better. Do not pull the steri-strips off - let them fall off ?Staples: Staples may be removed before you leave the hospital ?If you go home with staples, call [facility] at for an appointment with your surgeon?s nurse to have staples removed 10 days after surgery, [phone] ?Showering: You may shower two (2) days after your surgery unless your surgeon tells you differently ?Wash gently around incisions with warm soapy water, rinse well, and gently pat dry  ?If you have a drain (tube from your incision), you may need someone to hold this while you shower  ?No tub baths until staples are removed and incisions are healed   ?  ?Medications: Medications should be liquid or crushed if larger than the size of a dime ?Extended release pills (medication that releases a little bit at a time through the day) should not be crushed ?Depending on the size and number of medications you take, you may need to space (take a few throughout the day)/change the time you take your medications so that you do not over-fill your pouch (smaller stomach) ?Make sure you follow-up with your primary care physician to make medication changes needed during rapid weight loss and life-style changes ?If you have diabetes, follow up with the doctor that orders your diabetes medication(s) within one week after surgery and check   your blood sugar regularly. ?Do not drive while taking narcotics (pain medications) ?DO NOT take NSAID'S (Examples of NSAID's include ibuprofen, naproxen)  ?Diet:                    First 2 Weeks ? You will see the nutritionist about two (2) weeks after your surgery. The nutritionist will increase the types of foods you can eat if you are handling liquids well: ?If you have severe vomiting or nausea  and cannot handle clear liquids lasting longer than 1 day, call your surgeon  ?Protein Shake ?Drink at least 2 ounces of shake 5-6 times per day ?Each serving of protein shakes (usually 8 - 12 ounces) should have a minimum of:  ?15 grams of protein  ?And no more than 5 grams of carbohydrate  ?Goal for protein each day: ?Men = 80 grams per day ?Women = 60 grams per day ?Protein powder may be added to fluids such as non-fat milk or Lactaid milk or Soy milk (limit to 35 grams added protein powder per serving) ? ?Hydration ?Slowly increase the amount of water and other clear liquids as tolerated (See Acceptable Fluids) ?Slowly increase the amount of protein shake as tolerated  ? Sip fluids slowly and throughout the day ?May use sugar substitutes in small amounts (no more than 6 - 8 packets per day; i.e. Splenda) ? ?Fluid Goal ?The first goal is to drink at least 8 ounces of protein shake/drink per day (or as directed by the nutritionist);  See handout from pre-op Bariatric Education Class for examples of protein shake/drink.   ?Slowly increase the amount of protein shake you drink as tolerated ?You may find it easier to slowly sip shakes throughout the day ?It is important to get your proteins in first ?Your fluid goal is to drink 64 - 100 ounces of fluid daily ?It may take a few weeks to build up to this ?32 oz (or more) should be clear liquids  ?And  ?32 oz (or more) should be full liquids (see below for examples) ?Liquids should not contain sugar, caffeine, or carbonation ? ?Clear Liquids: ?Water or Sugar-free flavored water (i.e. Fruit H2O, Propel) ?Decaffeinated coffee or tea (sugar-free) ?Rianne Degraaf Lite, Wyler?s Lite, Minute Maid Lite ?Sugar-free Jell-O ?Bouillon or broth ?Sugar-free Popsicle:   *Less than 20 calories each; Limit 1 per day ? ?Full Liquids: ?Protein Shakes/Drinks + 2 choices per day of other full liquids ?Full liquids must be: ?No More Than 12 grams of Carbs per serving  ?No More Than 3 grams of Fat  per serving ?Strained low-fat cream soup ?Non-Fat milk ?Fat-free Lactaid Milk ?Sugar-free yogurt (Dannon Lite & Fit, Greek yogurt) ? ? ? ?  ?Vitamins and Minerals Start 1 day after surgery unless otherwise directed by your surgeon ?Bariatric Specific Complete Multivitamins ?Chewable Calcium Citrate with Vitamin D-3 ?(Example: 3 Chewable Calcium Plus 600 with Vitamin D-3) ?Take 500 mg three (3) times a day for a total of 1500 mg each day ?Do not take all 3 doses of calcium at one time as it may cause constipation, and you can only absorb 500 mg  at a time  ?Do not mix multivitamins containing iron with calcium supplements; take 2 hours apart ? ?Menstruating women and those at risk for anemia (a blood disease that causes weakness) may need extra iron ?Talk with your doctor to see if you need more iron ?If you need extra iron: Total daily Iron recommendation (including Vitamins) is 50 to 100   mg Iron/day ?Do not stop taking or change any vitamins or minerals until you talk to your nutritionist or surgeon ?Your nutritionist and/or surgeon must approve all vitamin and mineral supplements ?  ?Activity and Exercise: It is important to continue walking at home.  Limit your physical activity as instructed by your doctor.  During this time, use these guidelines: ?Do not lift anything greater than ten (10) pounds for at least two (2) weeks ?Do not go back to work or drive until your surgeon says you can ?You may have sex when you feel comfortable  ?It is VERY important for female patients to use a reliable birth control method; fertility often increases after surgery  ?Do not get pregnant for at least 18 months ?Start exercising as soon as your doctor tells you that you can ?Make sure your doctor approves any physical activity ?Start with a simple walking program ?Walk 5-15 minutes each day, 7 days per week.  ?Slowly increase until you are walking 30-45 minutes per day ?Consider joining our BELT program. [phone] or email  [email or web address] ?  ?Special Instructions Things to remember: ? ?Use your CPAP when sleeping if this applies to you, do not stop the use of CPAP unless directed by physician after a sleep study ?[place]

## 2021-10-12 NOTE — Discharge Summary (Signed)
Physician Discharge Summary  ?Wendy Watkins:[id number] DOB: [birth year] DOA: 10/11/2021 ? ?PCP: Wilson Singer, MD (Inactive) ? ?Admit date: 10/11/2021 ?Discharge date: 10/13/2021 ? ?Recommendations for Outpatient Follow-up:  ? ? ? Follow-up Information   ? ? Berna Bue, MD. Nyra Capes on 11/03/2021.   ?Specialty: General Surgery ?Why: at 9:10am.  Please arrive 15 minutes prior to your appointment time.  Thank you. ?Contact information: ?[address] ?[address] ?[place] [address] ?[phone] ? ? ?  ?  ? ? Berna Bue, MD. Go on 12/02/2021.   ?Specialty: General Surgery ?Why: at 9:20am.  Please arrive 15 minutes prior to your appointment time.  Thank you. ?Contact information: ?[address] ?[address] ?[place] [address] ?[phone] ? ? ?  ?  ? ?  ?  ? ?  ? ?Discharge Diagnoses:  ?Principal Problem: ?  Morbid obesity (HCC) ? ? ?Surgical Procedure: Laparoscopic Roux-en-Y gastric bypass, upper endoscopy ? ?Discharge Condition: Good ?Disposition: Home ? ?Diet recommendation: Postoperative gastric bypass diet ? ?Filed Weights  ? 10/11/21 0851  ?Weight: 111.1 kg  ? ? ? ?Hospital Course:  ?The patient was admitted for a planned laparoscopic Roux-en-Y gastric bypass. Please see operative note. Preoperatively the patient was given 5000 units of subcutaneous heparin for DVT prophylaxis. ERAS protocol was used. Postoperative prophylactic Lovenox dosing was started on the morning of postoperative day 1.  The patient was started on ice chips and water on the evening of POD 0 which they tolerated. The patient's diet was advanced to protein shakes which they also tolerated. The patient was ambulating without difficulty. Their vital signs are stable without fever or tachycardia. The patient had received discharge instructions and counseling. They were deemed stable for discharge. ? ?BP (!) 145/71 (BP Location: Right Arm)   Pulse 67   Temp (!) 97.4 ?F (36.3 ?C)   Resp 15   Ht 5\' 1"  (1.549 m)    Wt 111.1 kg   SpO2 99%   BMI 46.29 kg/m?  ?See rounding note ? ?Discharge Instructions ? ? ?Allergies as of 10/13/2021   ? ?   Reactions  ? Other Anaphylaxis  ? Peaches: throat swelling  ? ?  ? ?  ?Medication List  ?  ? ?STOP taking these medications   ? ?esomeprazole 20 MG capsule ?Commonly known as: NEXIUM ?  ?nitrofurantoin (macrocrystal-monohydrate) 100 MG capsule ?Commonly known as: Macrobid ?  ? ?  ? ?TAKE these medications   ? ?acetaminophen 500 MG tablet ?Commonly known as: TYLENOL ?Take 2 tablets (1,000 mg total) by mouth every 8 (eight) hours for 5 days. ?  ?clobetasol ointment 0.05 % ?Commonly known as: TEMOVATE ?APPLY OINTMENT TOPICALLY TO SKIN TWICE DAILY FOR 14 DAYS ?What changed:  ?how much to take ?how to take this ?when to take this ?reasons to take this ?additional instructions ?  ?FLUoxetine 20 MG capsule ?Commonly known as: PROZAC ?Take 1 capsule by mouth once daily ?  ?gabapentin 100 MG capsule ?Commonly known as: NEURONTIN ?Take 2 capsules (200 mg total) by mouth every 12 (twelve) hours. ?  ?levonorgestrel 20 MCG/24HR IUD ?Commonly known as: MIRENA ?by Intrauterine route. ?  ?NP Thyroid 120 MG tablet ?Generic drug: thyroid ?Take 1 tablet (120 mg total) by mouth 2 (two) times daily. ?  ?ondansetron 4 MG disintegrating tablet ?Commonly known as: ZOFRAN-ODT ?Dissolve 1 tablet (4 mg total) by mouth every 6 (six) hours as needed for nausea or vomiting. ?  ?pantoprazole 40 MG tablet ?Commonly  known as: PROTONIX ?Take 1 tablet (40 mg total) by mouth daily. Take this medication daily regardless of reflux symptoms ?  ?traMADol 50 MG tablet ?Commonly known as: ULTRAM ?Take 1 tablet (50 mg total) by mouth every 6 (six) hours as needed (pain). ?  ?Vitamin D3 250 MCG (10000 UT) Tabs ?Take 10,000 Units by mouth daily. ?  ? ?  ? ? Follow-up Information   ? ? Berna Bue, MD. Nyra Capes on 11/03/2021.   ?Specialty: General Surgery ?Why: at 9:10am.  Please arrive 15 minutes prior to your appointment time.  Thank  you. ?Contact information: ?[address] ?[address] ?[place] [address] ?[phone] ? ? ?  ?  ? ? Berna Bue, MD. Go on 12/02/2021.   ?Specialty: General Surgery ?Why: at 9:20am.  Please arrive 15 minutes prior to your appointment time.  Thank you. ?Contact information: ?[address] ?[address] ?[place] [address] ?[phone] ? ? ?  ?  ? ?  ?  ? ?  ? ? ? ?The results of significant diagnostics from this hospitalization (including imaging, microbiology, ancillary and laboratory) are listed below for reference.   ? ?Significant Diagnostic Studies: ?No results found. ? ?Labs: ?Basic Metabolic Panel: ?Recent Labs  ?Lab 10/12/21 ?0304 10/13/21 ?0354  ?NA 135 140  ?K 5.2* 4.1  ?CL 105 110  ?CO2 24 27  ?GLUCOSE 127* 95  ?BUN 13 11  ?CREATININE 0.83 0.78  ?CALCIUM 8.9 8.4*  ?MG 2.2 1.9  ? ?Liver Function Tests: ?Recent Labs  ?Lab 10/12/21 ?0304  ?AST 28  ?ALT 21  ?ALKPHOS 96  ?BILITOT 0.5  ?PROT 7.3  ?ALBUMIN 3.6  ? ? ?CBC: ?Recent Labs  ?Lab 10/12/21 ?0304 10/13/21 ?0354 10/13/21 ?1327  ?WBC 20.2* 11.8*  --   ?NEUTROABS 18.1* 8.0*  --   ?HGB 11.7* 9.7* 10.1*  ?HCT 36.3 30.6* 31.4*  ?MCV 94.0 95.0  --   ?PLT 395 317  --   ? ? ?CBG: ?No results for input(s): GLUCAP in the last 168 hours. ? ?Principal Problem: ?  Morbid obesity (HCC) ? ? ? ?Signed: ? ?Berna Bue MD FACS ?[facility], [place] ?[phone] ?10/13/2021, 6:23 PM ? ? ?

## 2021-10-12 NOTE — Progress Notes (Signed)
?  Transition of Care (TOC) Screening Note ? ? ?Patient Details  ?Name: Wendy Watkins ?Date of Birth: [birth year] ? ? ?Transition of Care (TOC) CM/SW Contact:    ?Alohilani Levenhagen, LCSW ?Phone Number: ?10/12/2021, 10:45 AM ? ? ? ?Transition of Care Department [facility]) has reviewed patient and no TOC needs have been identified at this time. We will continue to monitor patient advancement through interdisciplinary progression rounds. If new patient transition needs arise, please place a TOC consult. ? ? ?

## 2021-10-12 NOTE — Progress Notes (Signed)
Patient is unable to be viewed in med pyxis. Pharmacy contacted for assistance to be able to retrieve meds. Lovenox and tramadol walked up and tubed to be given to patient while issue is being resolved. ?

## 2021-10-12 NOTE — Progress Notes (Signed)
Patient has started protein. 

## 2021-10-12 NOTE — Progress Notes (Signed)
Patient alert and oriented, pain is controlled. Patient is tolerating fluids, advanced to protein shake today, patient is tolerating well. Reviewed Gastric Bypass discharge instructions with patient and patient is able to articulate understanding. Provided information on BELT program, Support Group and [facility] outpatient pharmacy. All questions answered, will continue to monitor.    

## 2021-10-13 LAB — CBC WITH DIFFERENTIAL/PLATELET
Abs Immature Granulocytes: 0.06 10*3/uL (ref 0.00–0.07)
Basophils Absolute: 0 10*3/uL (ref 0.0–0.1)
Basophils Relative: 0 %
Eosinophils Absolute: 0 10*3/uL (ref 0.0–0.5)
Eosinophils Relative: 0 %
HCT: 30.6 % — ABNORMAL LOW (ref 36.0–46.0)
Hemoglobin: 9.7 g/dL — ABNORMAL LOW (ref 12.0–15.0)
Immature Granulocytes: 1 %
Lymphocytes Relative: 25 %
Lymphs Abs: 3 10*3/uL (ref 0.7–4.0)
MCH: 30.1 pg (ref 26.0–34.0)
MCHC: 31.7 g/dL (ref 30.0–36.0)
MCV: 95 fL (ref 80.0–100.0)
Monocytes Absolute: 0.8 10*3/uL (ref 0.1–1.0)
Monocytes Relative: 7 %
Neutro Abs: 8 10*3/uL — ABNORMAL HIGH (ref 1.7–7.7)
Neutrophils Relative %: 67 %
Platelets: 317 10*3/uL (ref 150–400)
RBC: 3.22 MIL/uL — ABNORMAL LOW (ref 3.87–5.11)
RDW: 13.4 % (ref 11.5–15.5)
WBC: 11.8 10*3/uL — ABNORMAL HIGH (ref 4.0–10.5)
nRBC: 0 % (ref 0.0–0.2)

## 2021-10-13 LAB — BASIC METABOLIC PANEL
Anion gap: 3 — ABNORMAL LOW (ref 5–15)
BUN: 11 mg/dL (ref 6–20)
CO2: 27 mmol/L (ref 22–32)
Calcium: 8.4 mg/dL — ABNORMAL LOW (ref 8.9–10.3)
Chloride: 110 mmol/L (ref 98–111)
Creatinine, Ser: 0.78 mg/dL (ref 0.44–1.00)
GFR, Estimated: >60 mL/min (ref >60)
Glucose, Bld: 95 mg/dL (ref 70–99)
Potassium: 4.1 mmol/L (ref 3.5–5.1)
Sodium: 140 mmol/L (ref 135–145)

## 2021-10-13 LAB — MAGNESIUM: Magnesium: 1.9 mg/dL (ref 1.7–2.4)

## 2021-10-13 LAB — HEMOGLOBIN AND HEMATOCRIT, BLOOD
HCT: 31.4 % — ABNORMAL LOW (ref 36.0–46.0)
Hemoglobin: 10.1 g/dL — ABNORMAL LOW (ref 12.0–15.0)

## 2021-10-13 NOTE — Progress Notes (Signed)
S: Issues with epigastric pain after turning protein yesterday is really her only complaint.  No nausea, incisional or other abdominal pain, or other concerns. Pain with protein seems to be getting better ? ?O: ?Vitals, labs, intake/output, and orders reviewed at this time. Afebrile, HR 62-74, mostly normotensive/ mildly hypertensive this AM, sats 96% room air. PO 370. UOP 1100+1x. BMP unremarkable. WBC 11.8 (20.2 yesterday), Hgb 9.7 (11.7 yesterday, 12.4 preop), plt 317 (395 yesterday,  451 preop) ?One dose each of PRN dilaudid, robaxin, oxycodone, tramadol and zofran in last 24h. ? ?Gen: A&Ox3, no distress  ?H&N: EOMI, atraumatic, neck supple ?Chest: unlabored respirations, RRR ?Abd: soft, completely nontender, nondistended, incision(s) c/d/i without cellulitis or hematoma ?Ext: warm, no edema ?Neuro: grossly normal ? ?Lines/tubes/drains: PIV ? ?A/P: POD 2 s/p laparoscopic roux en y gastric bypass with hiatal hernia repair  ?-Continue clear liquids and protein shakes ?-Continue ambulation, SCDs while in bed, discontinue lovenox, pulmonary toilet ?-Doing better today and likely ready for discharge this afternoon. I suspect her hgb drop is dilutional as all cell lines are down and nothing on exam/vitals is concerning for active bleeding. Will recheck H&H this PM prior to decision for discharge.  ? ?Phylliss Blakes, MD FACS ?[facility] ? ?  ?

## 2021-10-13 NOTE — Progress Notes (Signed)
Discharge package printed and instructions given to patient. Patient verbalizes understanding. 

## 2021-10-13 NOTE — Plan of Care (Signed)
  Problem: Activity: Goal: Risk for activity intolerance will decrease Outcome: Progressing   Problem: Pain Managment: Goal: General experience of comfort will improve Outcome: Progressing   Problem: Safety: Goal: Ability to remain free from injury will improve Outcome: Progressing   

## 2021-10-13 NOTE — Progress Notes (Signed)
Patient alert and oriented, Post op day 2.  Provided support and encouragement.  Encouraged pulmonary toilet, ambulation and small sips of liquids.  All questions answered.  Will continue to monitor. 

## 2021-10-15 ENCOUNTER — Telehealth (HOSPITAL_COMMUNITY): Payer: Self-pay | Admitting: *Deleted

## 2021-10-15 NOTE — Telephone Encounter (Signed)
1.  Tell me about your pain and pain management? ?Pt denies any current pain. Pt stated that she did need to take one tramadol last night to help her sleep. ? ?2.  Let's talk about fluid intake.  How much total fluid are you taking in? ?Pt states that she is getting in at least 55oz of fluid including protein shakes, bottled water, and greek yogurt. Pt instructed to assess status and suggestions daily utilizing Hydration Action Plan on discharge folder and to call [facility] if in the "red zone".  ? ?3.  How much protein have you taken in the last 2 days? ?Pt states she is meeting her goal of 80g of protein each day with the protein shakes and greek yogurt. ? ?4.  Have you had nausea?  Tell me about when have experienced nausea and what you did to help? ?Pt denies nausea. ?  ?5.  Has the frequency or color changed with your urine? ?Pt states that she is urinating "fine" with no changes in frequency or urgency.   ?  ?6.  Tell me what your incisions look like? ?"Incisions look fine". Pt denies a fever, chills.  Pt states incisions are not swollen, open, or draining.  Pt encouraged to call [facility] if incisions change. ?  ?7.  Have you been passing gas? BM? ?Pt states that she is having BMs. Last BM 10/15/21.   ?  ?8.  If a problem or question were to arise who would you call?  Do you know contact numbers for [facility], [facility], and [facility]? ?Pt denies dehydration symptoms.  Pt can describe s/sx of dehydration.  Pt knows to call [facility] for surgical, [facility] for nutrition, and [facility] for non-urgent questions or concerns. ?  ?9.  How has the walking going? ?Pt states she is walking around and able to be active without difficulty. ?  ?10. Are you still using your incentive spirometer?  If so, how often? ?Pt states that she is doing the I.S. "sporadically". Pt encouraged to use incentive spirometer, at least 10x every hour while awake until she sees the surgeon. ? ?11.  How are your vitamins and calcium going?  How are you taking them? ?Pt states that she  is taking her supplements and vitamins without difficulty. ? ?Reminded patient that the first 30 days post-operatively are important for successful recovery.  Practice good hand hygiene, wearing a mask when appropriate (since optional in most places), and minimizing exposure to people who live outside of the home, especially if they are exhibiting any respiratory, GI, or illness-like symptoms.   ? ?

## 2021-10-26 ENCOUNTER — Encounter: Payer: Commercial Managed Care - PPO | Attending: Surgery | Admitting: Skilled Nursing Facility1

## 2021-10-26 DIAGNOSIS — Z6841 Body Mass Index (BMI) 40.0 and over, adult: Secondary | ICD-10-CM | POA: Diagnosis not present

## 2021-10-26 DIAGNOSIS — Z713 Dietary counseling and surveillance: Secondary | ICD-10-CM | POA: Diagnosis not present

## 2021-10-26 DIAGNOSIS — E669 Obesity, unspecified: Secondary | ICD-10-CM

## 2021-10-26 NOTE — Progress Notes (Signed)
2 Week Post-Operative Nutrition Class ?  ?Patient was seen on 10/26/2021 for Post-Operative Nutrition education at the [facility].  ?  ?Surgery date: 10/11/2021 ?Surgery type: Laparoscopic Roux-En-Y Gastric Bypass ?Start weight at [facility]: 246.8 ?Weight today: 233.3 ?  ?Body Composition Scale 10/26/2021  ?Current Body Weight 233.3  ?Total Body Fat % 45.7  ?Visceral Fat   ?Fat-Free Mass %   ? Total Body Water % 41.6  ?Muscle-Mass lbs   ?BMI 44.2  ?Body Fat Displacement   ?       Torso  lbs   ?       Left Leg  lbs   ?       Right Leg  lbs   ?       Left Arm  lbs   ?       Right Arm   lbs   ? ?Clinical  ?Medical hx: anxiety, GERD, hyperlipemia, PCOS, migraines  ?Medications: see list ?Labs: LDL 140, iron saturation 14 ?Notable signs/symptoms: n/a ?Any previous deficiencies? YES: vitamin D, iron ? ?  ?The following the learning objectives were met by the patient during this course: ?Identifies Phase 3 (Soft, High Proteins) Dietary Goals and will begin from 2 weeks post-operatively to 2 months post-operatively ?Identifies appropriate sources of fluids and proteins  ?Identifies appropriate fat sources and healthy verses unhealthy fat types   ?States protein recommendations and appropriate sources post-operatively ?Identifies the need for appropriate texture modifications, mastication, and bite sizes when consuming solids ?Identifies appropriate fat consumption and sources ?Identifies appropriate multivitamin and calcium sources post-operatively ?Describes the need for physical activity post-operatively and will follow MD recommendations ?States when to call healthcare provider regarding medication questions or post-operative complications ?  ?Handouts given during class include: ?Phase 3A: Soft, High Protein Diet Handout ?Phase 3 High Protein Meals ?Healthy Fats ?  ?Follow-Up Plan: ?Patient will follow-up at [facility] in 6 weeks for 2 month post-op nutrition visit for diet advancement per MD.  ?

## 2021-11-01 ENCOUNTER — Telehealth: Payer: Self-pay | Admitting: Skilled Nursing Facility1

## 2021-11-01 NOTE — Telephone Encounter (Signed)
RD called pt to verify fluid intake once starting soft, solid proteins 2 week post-bariatric surgery.  ? ?Daily Fluid intake: 64 oz ?Daily Protein intake: 60 g ?Bowel Habits: every day to every other day; taking mirilax  ? ?Concerns/issues:  ? ?None stated ?

## 2021-12-07 ENCOUNTER — Encounter: Payer: Self-pay | Admitting: Skilled Nursing Facility1

## 2021-12-07 ENCOUNTER — Encounter: Attending: Surgery | Admitting: Skilled Nursing Facility1

## 2021-12-07 NOTE — Progress Notes (Signed)
Bariatric Nutrition Follow-Up Visit Medical Nutrition Therapy    NUTRITION ASSESSMENT    Surgery date: 10/11/2021 Surgery type: Laparoscopic Roux-En-Y Gastric Bypass Start weight at [facility]: 246.8 Weight today: 217.6 pounds   Body Composition Scale 10/26/2021 12/07/2021  Current Body Weight 233.3 217.6  Total Body Fat % 45.7 44  Visceral Fat  15  Fat-Free Mass %  55.9   Total Body Water % 41.6 42.4  Muscle-Mass lbs  29  BMI 44.2 41.2  Body Fat Displacement           Torso  lbs  59.3         Left Leg  lbs  11.8         Right Leg  lbs  11.8         Left Arm  lbs  5.9         Right Arm   lbs  5.9   Clinical  Medical hx: anxiety, GERD, hyperlipemia, PCOS, migraines  Medications: see list Labs: Notable signs/symptoms: n/a Any previous deficiencies? YES: vitamin D, iron   Lifestyle & Dietary Hx  Pt states she struggles with no appetite skipping at least one meal multiple times a week.   Pt states she feels tired after her short walks.  Pt states she works for [employer] now.    Estimated daily fluid intake: 40 oz Estimated daily protein intake: 80 g Supplements: multi and calcium Current average weekly physical activity: joined a gym walking 1 mile daily and some weights 2 days a week   24-Hr Dietary Recall First Meal: protein shake or eggs and bacon Snack:  maybe cheese  Second Meal: chili or p3 snack Snack:  protein sugar free whipped cream and greek yogurt Third Meal: chicken and cheese or beans and chicken or roast beef Snack: sugar free werther's original  Beverages: protein water, protein shakes, light pineapple juice, sugar free applejuice water + flavorings  Post-Op Goals/ Signs/ Symptoms Using straws: no Drinking while eating: no Chewing/swallowing difficulties: no Changes in vision: no Changes to mood/headaches: no Hair loss/changes to skin/nails: no Difficulty focusing/concentrating: no Sweating: no Limb weakness: no Dizziness/lightheadedness:  no Palpitations: no  Carbonated/caffeinated beverages: no N/V/D/C/Gas: no Abdominal pain: no Dumping syndrome: no    NUTRITION DIAGNOSIS  Overweight/obesity ([place]-3.3) related to past poor dietary habits and physical inactivity as evidenced by completed bariatric surgery and following dietary guidelines for continued weight loss and healthy nutrition status.     NUTRITION INTERVENTION Nutrition counseling (C-1) and education (E-2) to facilitate bariatric surgery goals, including: Diet advancement to the next phase (phase 4) now including non starchy vegetables The importance of consuming adequate calories as well as certain nutrients daily due to the body's need for essential vitamins, minerals, and fats The importance of daily physical activity and to reach a goal of at least 150 minutes of moderate to vigorous physical activity weekly (or as directed by their physician) due to benefits such as increased musculature and improved lab values The importance of intuitive eating specifically learning hunger-satiety cues and understanding the importance of learning a new body: The importance of mindful eating to avoid grazing behaviors   Goals: -Continue to aim for a minimum of 64 fluid ounces 7 days a week with at least 30 ounces being plain water  -Eat non-starchy vegetables 2 times a day 7 days a week  -Start out with soft cooked vegetables today and tomorrow; if tolerated begin to eat raw vegetables or cooked including salads  -Eat your 3  ounces of protein first then start in on your non-starchy vegetables; once you understand how much of your meal leads to satisfaction and not full while still eating 3 ounces of protein and non-starchy vegetables you can eat them in any order   -Continue to aim for 30 minutes of activity at least 5 times a week  -Do NOT cook with/add to your food: alfredo sauce, cheese sauce, barbeque sauce, ketchup, fat back, butter, bacon grease, grease, Crisco, OR  SUGAR   Handouts Provided Include  Phase 4  Learning Style & Readiness for Change Teaching method utilized: Visual & Auditory  Demonstrated degree of understanding via: Teach Back  Readiness Level: action Barriers to learning/adherence to lifestyle change: none identified   RD's Notes for Next Visit Assess adherence to pt chosen goals    MONITORING & EVALUATION Dietary intake, weekly physical activity, body weight  Next Steps Patient is to follow-up in 3 months

## 2021-12-09 ENCOUNTER — Ambulatory Visit: Admitting: Skilled Nursing Facility1

## 2022-03-10 ENCOUNTER — Encounter: Admitting: Dietician

## 2022-03-10 ENCOUNTER — Ambulatory Visit: Admitting: Skilled Nursing Facility1

## 2022-07-07 DIAGNOSIS — L910 Hypertrophic scar: Secondary | ICD-10-CM | POA: Diagnosis not present

## 2022-07-08 ENCOUNTER — Other Ambulatory Visit (HOSPITAL_COMMUNITY): Payer: Self-pay

## 2022-07-26 ENCOUNTER — Other Ambulatory Visit (HOSPITAL_COMMUNITY): Payer: Self-pay

## 2022-07-26 DIAGNOSIS — Z975 Presence of (intrauterine) contraceptive device: Secondary | ICD-10-CM | POA: Diagnosis not present

## 2022-07-26 DIAGNOSIS — Z6833 Body mass index (BMI) 33.0-33.9, adult: Secondary | ICD-10-CM | POA: Diagnosis not present

## 2022-07-26 DIAGNOSIS — F32A Depression, unspecified: Secondary | ICD-10-CM | POA: Diagnosis not present

## 2022-07-26 DIAGNOSIS — Z862 Personal history of diseases of the blood and blood-forming organs and certain disorders involving the immune mechanism: Secondary | ICD-10-CM | POA: Diagnosis not present

## 2022-07-26 DIAGNOSIS — E079 Disorder of thyroid, unspecified: Secondary | ICD-10-CM | POA: Diagnosis not present

## 2022-07-26 DIAGNOSIS — N3946 Mixed incontinence: Secondary | ICD-10-CM | POA: Diagnosis not present

## 2022-07-26 DIAGNOSIS — Z01419 Encounter for gynecological examination (general) (routine) without abnormal findings: Secondary | ICD-10-CM | POA: Diagnosis not present

## 2022-07-26 DIAGNOSIS — E669 Obesity, unspecified: Secondary | ICD-10-CM | POA: Diagnosis not present

## 2022-07-26 DIAGNOSIS — Z1159 Encounter for screening for other viral diseases: Secondary | ICD-10-CM | POA: Diagnosis not present

## 2022-08-01 DIAGNOSIS — F4323 Adjustment disorder with mixed anxiety and depressed mood: Secondary | ICD-10-CM | POA: Diagnosis not present

## 2022-08-05 ENCOUNTER — Encounter (HOSPITAL_COMMUNITY): Payer: Self-pay | Admitting: *Deleted

## 2022-08-11 DIAGNOSIS — L910 Hypertrophic scar: Secondary | ICD-10-CM | POA: Diagnosis not present

## 2022-08-15 ENCOUNTER — Other Ambulatory Visit (HOSPITAL_COMMUNITY): Payer: Self-pay | Admitting: Women's Health

## 2022-08-15 DIAGNOSIS — Z1231 Encounter for screening mammogram for malignant neoplasm of breast: Secondary | ICD-10-CM

## 2022-08-20 DIAGNOSIS — F4323 Adjustment disorder with mixed anxiety and depressed mood: Secondary | ICD-10-CM | POA: Diagnosis not present

## 2022-09-02 ENCOUNTER — Ambulatory Visit (HOSPITAL_COMMUNITY)

## 2022-09-21 DIAGNOSIS — Z30433 Encounter for removal and reinsertion of intrauterine contraceptive device: Secondary | ICD-10-CM | POA: Diagnosis not present

## 2022-12-02 DIAGNOSIS — R748 Abnormal levels of other serum enzymes: Secondary | ICD-10-CM | POA: Diagnosis not present

## 2023-01-27 DIAGNOSIS — R6882 Decreased libido: Secondary | ICD-10-CM | POA: Diagnosis not present

## 2023-01-27 DIAGNOSIS — Z30431 Encounter for routine checking of intrauterine contraceptive device: Secondary | ICD-10-CM | POA: Diagnosis not present

## 2023-01-27 DIAGNOSIS — R748 Abnormal levels of other serum enzymes: Secondary | ICD-10-CM | POA: Diagnosis not present

## 2023-02-06 DIAGNOSIS — R748 Abnormal levels of other serum enzymes: Secondary | ICD-10-CM | POA: Diagnosis not present

## 2023-03-10 DIAGNOSIS — L309 Dermatitis, unspecified: Secondary | ICD-10-CM | POA: Diagnosis not present

## 2023-03-10 DIAGNOSIS — E669 Obesity, unspecified: Secondary | ICD-10-CM | POA: Diagnosis not present

## 2023-03-10 DIAGNOSIS — E039 Hypothyroidism, unspecified: Secondary | ICD-10-CM | POA: Diagnosis not present

## 2023-03-10 DIAGNOSIS — Z6831 Body mass index (BMI) 31.0-31.9, adult: Secondary | ICD-10-CM | POA: Diagnosis not present

## 2023-03-14 DIAGNOSIS — L723 Sebaceous cyst: Secondary | ICD-10-CM | POA: Diagnosis not present

## 2023-05-17 DIAGNOSIS — E039 Hypothyroidism, unspecified: Secondary | ICD-10-CM | POA: Diagnosis not present

## 2023-05-17 DIAGNOSIS — E669 Obesity, unspecified: Secondary | ICD-10-CM | POA: Diagnosis not present

## 2023-05-18 LAB — LIPID PANEL
LDL Cholesterol: 104
Triglycerides: 50 (ref 40–160)

## 2023-05-18 LAB — BASIC METABOLIC PANEL WITH GFR
BUN: 11 (ref 4–21)
Creatinine: 0.9 (ref 0.5–1.1)

## 2023-05-18 LAB — HEMOGLOBIN A1C: Hemoglobin A1C: 5.5

## 2023-05-18 LAB — TSH: TSH: 1.51 (ref 0.41–5.90)

## 2023-05-18 LAB — COMPREHENSIVE METABOLIC PANEL WITH GFR: eGFR: 80

## 2023-05-19 ENCOUNTER — Encounter (HOSPITAL_COMMUNITY): Payer: Self-pay | Admitting: *Deleted

## 2023-09-08 DIAGNOSIS — Z9884 Bariatric surgery status: Secondary | ICD-10-CM | POA: Diagnosis not present

## 2023-09-13 ENCOUNTER — Encounter: Payer: Self-pay | Admitting: *Deleted

## 2023-10-30 ENCOUNTER — Telehealth: Payer: Self-pay | Admitting: *Deleted

## 2023-10-30 NOTE — Telephone Encounter (Signed)
  Procedure: COLONOSCOPY  Height: 5'1 Weight: 165LBS        Have you had a colonoscopy before? NO  Do you have family history of colon cancer?  NO  Do you have a family history of polyps? NO  Previous colonoscopy with polyps removed? NO  Do you have a history colorectal cancer?   NO  Are you diabetic?  NO  Do you have a prosthetic or mechanical heart valve? NO  Do you have a pacemaker/defibrillator?   NO  Have you had endocarditis/atrial fibrillation?  NO  Do you use supplemental oxygen/CPAP?  NO  Have you had joint replacement within the last 12 months?  NO  Do you tend to be constipated or have to use laxatives?  NO   Do you have history of alcohol use? If yes, how much and how often.  NO  Do you have history or are you using drugs? If yes, what do are you  using?  NO  Have you ever had a stroke/heart attack?  NO  Have you ever had a heart or other vascular stent placed,?NO  Do you take weight loss medication? NO  female patients,: have you had a hysterectomy? NO                              are you post menopausal?  NO                              do you still have your menstrual cycle? YES    Date of last menstrual period?   Do you take any blood-thinning medications such as: (Plavix, aspirin, Coumadin, Aggrenox, Brilinta, Xarelto, Eliquis, Pradaxa, Savaysa or Effient)? NO  If yes we need the name, milligram, dosage and who is prescribing doctor:               Current Outpatient Medications  Medication Sig Dispense Refill   clobetasol  ointment (TEMOVATE ) 0.05 % APPLY OINTMENT TOPICALLY TO SKIN TWICE DAILY FOR 14 DAYS (Patient taking differently: Apply 1 application  topically daily as needed (eczema).) 30 g 0   FLUoxetine  (PROZAC ) 20 MG capsule Take 1 capsule by mouth once daily 30 capsule 3   levonorgestrel  (MIRENA ) 20 MCG/24HR IUD by Intrauterine route.     No current facility-administered medications for this visit.    Allergies  Allergen Reactions    Other Anaphylaxis    Peaches: throat swelling

## 2023-11-02 ENCOUNTER — Telehealth: Payer: Self-pay | Admitting: Nurse Practitioner

## 2023-11-02 ENCOUNTER — Other Ambulatory Visit (HOSPITAL_COMMUNITY): Payer: Self-pay

## 2023-11-02 ENCOUNTER — Other Ambulatory Visit: Payer: Self-pay

## 2023-11-02 MED ORDER — FREESTYLE LIBRE 3 PLUS SENSOR MISC
11 refills | Status: AC
  Filled 2023-11-02: qty 2, 28d supply, fill #0
  Filled 2023-11-26: qty 2, 28d supply, fill #1
  Filled 2023-12-24: qty 2, 28d supply, fill #2
  Filled 2024-01-09 – 2024-01-16 (×2): qty 2, 28d supply, fill #3
  Filled 2024-02-16: qty 2, 28d supply, fill #4
  Filled 2024-03-13: qty 2, 28d supply, fill #5
  Filled 2024-04-08: qty 2, 28d supply, fill #6
  Filled 2024-04-09 – 2024-05-05 (×2): qty 2, 28d supply, fill #7
  Filled 2024-06-01: qty 2, 28d supply, fill #8
  Filled 2024-07-02: qty 2, 28d supply, fill #9
  Filled 2024-07-27: qty 6, 84d supply, fill #10
  Filled 2024-08-07: qty 6, 90d supply, fill #10

## 2023-11-02 NOTE — Telephone Encounter (Signed)
 Pt has a Therapist, art and was prescribed by Dr Quentin Brunner office gave this to her. She has not had anymore recent labs but in the mornings her sugar is fine around 80's mid 70's. She said that if she doesn't eat she's fine, but if she eats it goes to 215 but within the hour it bottoms out to 54. Dr Quentin Brunner office wants her to see us . You recently stated she did not need to see us  but Hall's office is not doing anything with her sugar.  Call back # [phone]

## 2023-11-02 NOTE — Telephone Encounter (Signed)
 We can see her on grounds of hypoglycemia, the previous referral did not include much data thus it was denied previously, but given her circumstances, we can accept.  Next routine slot.

## 2023-11-08 ENCOUNTER — Other Ambulatory Visit: Payer: Self-pay | Admitting: *Deleted

## 2023-11-08 ENCOUNTER — Encounter: Payer: Self-pay | Admitting: *Deleted

## 2023-11-08 DIAGNOSIS — Z1211 Encounter for screening for malignant neoplasm of colon: Secondary | ICD-10-CM

## 2023-11-08 MED ORDER — PEG 3350-KCL-NA BICARB-NACL 420 G PO SOLR: 4000.0000 mL | Freq: Once | ORAL | 0 refills | Status: AC

## 2023-11-08 NOTE — Telephone Encounter (Signed)
 LMOVM to return call.

## 2023-11-08 NOTE — Telephone Encounter (Signed)
 Pt has been scheduled for 12/11/23 with Dr.Rourk, instructions mailed and prep sent to the pharmacy.

## 2023-11-08 NOTE — Telephone Encounter (Signed)
 ASA 2.  Needs UPT.

## 2023-11-09 ENCOUNTER — Encounter (INDEPENDENT_AMBULATORY_CARE_PROVIDER_SITE_OTHER): Payer: Self-pay | Admitting: *Deleted

## 2023-11-09 NOTE — Telephone Encounter (Signed)
 Referral completed, [facility] apt letter sent to PCP

## 2023-11-21 ENCOUNTER — Telehealth: Payer: Self-pay | Admitting: Nurse Practitioner

## 2023-11-21 NOTE — Telephone Encounter (Signed)
 Yes sorry

## 2023-11-21 NOTE — Telephone Encounter (Deleted)
 Is this in on the wrong patient? I have never seen the patient, she's down for a new consult.

## 2023-11-21 NOTE — Telephone Encounter (Deleted)
 Pt made an appt. You have not seen her since September. She said her average of 30 days is 250 for her readings. She is asking [place] [facility].

## 2023-11-28 ENCOUNTER — Other Ambulatory Visit (HOSPITAL_COMMUNITY): Payer: Self-pay

## 2023-12-01 NOTE — Patient Instructions (Signed)
Preventing Hypoglycemia Hypoglycemia is when the amount of sugar, or glucose, in your blood is too low. Low blood sugar can happen if you have diabetes or if you don't have diabetes. It may be an emergency. Work with your health care provider to make and change your meal plan as needed. This can help prevent low blood sugar. What can increase my risk? You may be more likely to get low blood sugar if: You take insulin or other diabetes medicines. You skip or delay a meal or snack. You get sick. How can low blood sugar affect me? Mild symptoms Mild cases may not cause symptoms. If you do have symptoms, they may include: Hunger or feeling like you may vomit. Sweating and feeling cold to the touch. Feeling dizzy or light-headed. Being sleepy or having trouble sleeping. A fast heart rate. A headache. Blurry eyesight. Mood changes. These include feeling worried, nervous, or easily annoyed. Tingling or numbness around your mouth, lips, or tongue. If a mild case of low blood sugar isn't treated, it can become moderate or severe. Moderate symptoms If you have a moderate case, you may: Feel confused. Have changes in the way you act or move. Feel weak. Have an uneven heartbeat. Severe symptoms Having very low blood sugar is an emergency. It can cause: Fainting. Seizures. A coma. Death. What nutrition changes can I make? Work with your provider or an expert in healthy eating called a dietitian to make a meal plan. Eat meals at set times. Have snacks between meals, as told by your provider. Donot skip or delay meals or snacks. What other actions can I take to prevent low blood sugar?  Work closely with your provider to manage your blood sugar. Make sure you know: What your blood sugar should be. How and when to check your blood sugar. The symptoms of low blood sugar. Be sure to eat food when you drink alcohol. When you're sick, check your blood sugar more often. Make a sick day plan  in advance with your provider. Follow this plan when you can't eat or drink like normal. Always check your blood sugar before, during, and after exercise. How is this treated? Treating low blood sugar If you have low blood sugar, eat or drink something with sugar in it right away. The food or drink should have 15 grams of a fast-acting carbohydrate (carb). Options include: 4 oz (120 mL) of fruit juice. 4 oz (120 mL) of soda (not diet soda). A few pieces of hard candy. Check food labels to see how many pieces to eat. 1 Tbsp (15 mL) of sugar or honey. 4 glucose tablets. 1 tube of glucose gel. Treating low blood sugar if you have diabetes If you're alert and can swallow safely, follow the 15:15 rule: Take 15 grams of a fast-acting carb. Talk with your provider about how much carb you should take. Check your blood sugar 15 minutes after you take the carb. If your blood sugar is still at or below 70 mg/dL (3.9 mmol/L), take 15 grams of a carb again. If your blood sugar doesn't go above 70 mg/dL (3.9 mmol/L) after 3 tries, get help right away. After your blood sugar goes back to normal, eat a meal or a snack within 1 hour. Treating very low blood sugar If your blood sugar is less than 54 mg/dL (3 mmol/L), it's an emergency. Get help right away. If you can't eat or drink, you will need to be given glucagon. A family member or friend  should learn how to check your blood sugar and give you glucagon. Ask your provider if you should keep a glucagon kit at home. You may also need to be treated in a hospital. Where to find more information American Diabetes Association (ADA): diabetes.[employer] of Diabetes and Digestive and Kidney Diseases (NIDDK): [employer]Sync.si Association of Diabetes Care & Education Specialists: diabeteseducator.org Contact a health care provider if: You have diabetes and are having trouble keeping your blood sugar in the right range. You have low blood sugar  often. Get help right away if: You can't get your blood sugar above 70 mg/dL (3.9 mmol/L) after 3 tries. Your blood sugar is below 54 mg/dL (3 mmol/L). You faint. You have a seizure. These symptoms may be an emergency. Call 911 right away. Do not wait to see if the symptoms will go away. Do not drive yourself to the hospital. This information is not intended to replace advice given to you by your health care provider. Make sure you discuss any questions you have with your health care provider. Document Revised: 09/08/2022 Document Reviewed: 09/08/2022 Elsevier Patient Education  2024 [employer].

## 2023-12-04 ENCOUNTER — Ambulatory Visit (INDEPENDENT_AMBULATORY_CARE_PROVIDER_SITE_OTHER): Payer: Self-pay | Admitting: Nurse Practitioner

## 2023-12-04 ENCOUNTER — Encounter: Payer: Self-pay | Admitting: Nurse Practitioner

## 2023-12-04 VITALS — BP 112/72 | HR 60 | Ht 61.0 in | Wt 176.2 lb

## 2023-12-04 DIAGNOSIS — E162 Hypoglycemia, unspecified: Secondary | ICD-10-CM

## 2023-12-04 LAB — POCT GLYCOSYLATED HEMOGLOBIN (HGB A1C): Hemoglobin A1C: 5.2 % (ref 4.0–5.6)

## 2023-12-04 NOTE — Progress Notes (Signed)
 Endocrinology Consult Note                                            12/04/2023, 2:12 PM   Subjective:    Patient ID: Wendy Watkins, female    DOB: 1976-06-15, PCP Wendi Ham, NP   Past Medical History:  Diagnosis Date   Chiari I malformation (HCC)    Eczema    GERD (gastroesophageal reflux disease)    Heavy menstrual period    History of iron deficiency anemia    Hyperlipidemia    Hypothyroidism    Obesity    PCOS (polycystic ovarian syndrome)    PMS (premenstrual syndrome) 10/11/2016   Urge incontinence    Vitamin D deficiency    Wears contact lenses    Wears glasses    Wears glasses    Past Surgical History:  Procedure Laterality Date   CALCANEAL OSTEOTOMY Left 12/05/2018   Procedure: EVANCALCANEAL OSTEOTOMY;  Surgeon: Charity Conch, DPM;  Location: [facility] [place];  Service: Podiatry;  Laterality: Left;   CALCANEAL OSTEOTOMY Right 05/08/2019   Procedure: EVAN CALCANEAL OSTEOTOMY;  Surgeon: Charity Conch, DPM;  Location: [facility] [place];  Service: Podiatry;  Laterality: Right;   FLAT FOOT RECONSTRUCTION-TAL GASTROC RECESSION Right 05/08/2019   Procedure: FLAT FOOT RECONSTRUCTION-TAL GASTROC RECESSION;  Surgeon: Charity Conch, DPM;  Location: [facility];  Service: Podiatry;  Laterality: Right;   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/2021   Procedure: LAPAROSCOPIC ROUX-EN-Y GASTRIC BYPASS WITH UPPER ENDOSCOPY;  Surgeon: Adalberto Acton, MD;  Location: [facility];  Service: General;  Laterality: N/A;   GASTROC RECESSION EXTREMITY Left 12/05/2018   Procedure: GASTROC RECESSION EXTREMITY;  Surgeon: Charity Conch, DPM;  Location: [facility];  Service: Podiatry;  Laterality: Left;   HIATAL HERNIA REPAIR N/A 10/11/2021   Procedure: HERNIA REPAIR HIATAL;  Surgeon: Adalberto Acton, MD;  Location: [facility];  Service: General;  Laterality: N/A;   OSTECTOMY Left 12/05/2018   Procedure: COTTON OSTEOTOMY, MEDIAL  CALCANEAL SLIDE OSTEOTOMY, MANIPULATION OF THE ANKLE UNDER ANESTHESIA;  Surgeon: Charity Conch, DPM;  Location: [facility] [place];  Service: Podiatry;  Laterality: Left;   STERIOD INJECTION Left 05/08/2019   Procedure: STEROID INJECTION;  Surgeon: Charity Conch, DPM;  Location: [facility] [place];  Service: Podiatry;  Laterality: Left;   TUBAL LIGATION  2003   UPPER GI ENDOSCOPY N/A 10/11/2021   Procedure: UPPER GI ENDOSCOPY;  Surgeon: Adalberto Acton, MD;  Location: [facility];  Service: General;  Laterality: N/A;   WISDOM TOOTH EXTRACTION     Social History   Socioeconomic History   Marital status: Married    Spouse name: Margaretmary Shaver   Number of children: 3   Years of education: 14   Highest education level: Not on file  Occupational History   Occupation: stay at home    Comment: husband is retired  Tobacco Use   Smoking status: Never   Smokeless tobacco: Never  Vaping Use   Vaping status: Never Used  Substance and Sexual Activity   Alcohol use: Yes    Comment: socially wine   Drug use: No   Sexual activity: Yes    Birth control/protection: Surgical  Other Topics Concern   Not on file  Social History Narrative   Husband Margaretmary Shaver - [employer] -  [employer] retired with PTSD   3 children at home in [employer] is in college- psychology   Tries to exercise   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   Social Drivers of Corporate investment banker Strain: Not on file  Food Insecurity: Not on file  Transportation Needs: Not on file  Physical Activity: Not on file  Stress: Not on file  Social Connections: Not on file   Family History  Problem Relation Age of Onset   Hyperlipidemia Mother    Hypertension Mother    Arthritis Father    Cancer Father        lymphoma   Heart disease Maternal Grandmother        CHF   Hyperlipidemia Maternal Grandmother    Hypertension Maternal Grandmother    Diabetes Maternal Grandmother    Cancer Paternal Grandmother        ovarian   Kidney  disease Paternal Grandfather    Alcohol abuse Paternal Grandfather    Outpatient Encounter Medications as of 12/04/2023  Medication Sig   clobetasol  ointment (TEMOVATE ) 0.05 % APPLY OINTMENT TOPICALLY TO SKIN TWICE DAILY FOR 14 DAYS (Patient taking differently: Apply 1 application  topically daily as needed (eczema).)   Continuous Glucose Sensor (FREESTYLE LIBRE 3 PLUS SENSOR) MISC Use as directed.   FLUoxetine  (PROZAC ) 20 MG capsule Take 1 capsule by mouth once daily   levonorgestrel  (MIRENA ) 20 MCG/24HR IUD by Intrauterine route.   No facility-administered encounter medications on file as of 12/04/2023.   ALLERGIES: Allergies  Allergen Reactions   Other Anaphylaxis    Peaches: throat swelling    VACCINATION STATUS: Immunization History  Administered Date(s) Administered   Influenza-Unspecified 02/26/2019   PFIZER(Purple Top)SARS-COV-2 Vaccination 09/08/2019, 09/29/2019   Pfizer Covid-19 Vaccine Bivalent Booster 18yrs & up 11/27/2020   Pfizer Sars-cov-2 Pediatric Vaccine(50mos to <81yrs) 09/29/2019   Tetanus 04/16/2019    HPI Wendy Watkins is 48 y.o. female who presents today with a medical history as above. she is being seen in consultation for hypoglycemia requested by Wendi Ham, NP.  she has been dealing with symptoms of shakiness, sweatiness, mental fog for a few months now.  She notes her symptoms are worse after eating, although she does have some drops in glucose at night as well.  Her PCP did prescribe a CGM.  Her TIR is 89%, TAR 4%, TBR 7% with a GMI of 5.7%.  She notes she drinks mostly water , will have coffee with splenda and creamer in the mornings.  She eats pretty much what she wants at this stage, just in lower quantities.  She does maintain an active lifestyle, walking 45 minutes on average daily.  she has history of gastric bypass surgery in 2023.  She notes her dad did have lymphoma which resulted in him having a partial pancreatectomy and splenectomy in the  past.  Review of systems  Constitutional: + Minimally fluctuating body weight,  current Body mass index is 33.29 kg/m. , + fatigue, no subjective hyperthermia, no subjective hypothermia Eyes: no blurry vision, no xerophthalmia ENT: no sore throat, no nodules palpated in throat, no dysphagia/odynophagia, no hoarseness Cardiovascular: no chest pain, no shortness of breath, no palpitations, no leg swelling Respiratory: no cough, no shortness of breath Gastrointestinal: no nausea/vomiting/diarrhea Musculoskeletal: no muscle/joint aches Skin: no rashes, no hyperemia Neurological: no tremors, no numbness, no tingling, no dizziness Psychiatric: no depression, no anxiety  Objective:        12/04/2023    1:21 PM 12/07/2021  10:53 AM 10/26/2021    5:16 PM  Vitals with BMI  Height 5\' 1"  5\' 1"  5\' 1"   Weight 176 lbs 3 oz 217 lbs 10 oz 233 lbs 5 oz  BMI 33.31 41.14 44.1  Systolic 112    Diastolic 72    Pulse 60      BP 112/72 (BP Location: Left Arm, Patient Position: Sitting, Cuff Size: Large)   Pulse 60   Ht 5\' 1"  (1.549 m)   Wt 176 lb 3.2 oz (79.9 kg)   BMI 33.29 kg/m   Wt Readings from Last 3 Encounters:  12/04/23 176 lb 3.2 oz (79.9 kg)  12/07/21 217 lb 9.6 oz (98.7 kg)  10/26/21 233 lb 4.8 oz (105.8 kg)     Physical Exam- Limited  Constitutional:  Body mass index is 33.29 kg/m. , not in acute distress, normal state of mind Eyes:  EOMI, no exophthalmos Neck: Supple Cardiovascular: RRR, no murmurs, rubs, or gallops, no edema Respiratory: Adequate breathing efforts, no crackles, rales, rhonchi, or wheezing Musculoskeletal: no gross deformities, strength intact in all four extremities, no gross restriction of joint movements Skin:  no rashes, no hyperemia Neurological: no tremor with outstretched hands  CMP ( most recent) CMP     Component Value Date/Time   NA 140 10/13/2021 0354   K 4.1 10/13/2021 0354   CL 110 10/13/2021 0354   CO2 27 10/13/2021 0354   GLUCOSE 95  10/13/2021 0354   BUN 11 05/18/2023 0000   CREATININE 0.9 05/18/2023 0000   CREATININE 0.78 10/13/2021 0354   CREATININE 0.70 09/16/2019 1507   CALCIUM 8.4 (L) 10/13/2021 0354   PROT 7.3 10/12/2021 0304   ALBUMIN 3.6 10/12/2021 0304   AST 28 10/12/2021 0304   ALT 21 10/12/2021 0304   ALKPHOS 96 10/12/2021 0304   BILITOT 0.5 10/12/2021 0304   EGFR 80 05/18/2023 0000   GFRNONAA >60 10/13/2021 0354   GFRNONAA 106 09/16/2019 1507     Diabetic Labs (most recent): Lab Results  Component Value Date   HGBA1C 5.2 12/04/2023   HGBA1C 5.5 05/18/2023   HGBA1C 5.1 04/16/2019     Lipid Panel ( most recent) Lipid Panel     Component Value Date/Time   CHOL 201 (H) 04/16/2019 1212   TRIG 50 05/18/2023 0000   HDL 42 (L) 04/16/2019 1212   CHOLHDL 4.8 04/16/2019 1212   VLDL 13 10/11/2016 0906   LDLCALC 104 05/18/2023 0000   LDLCALC 143 (H) 04/16/2019 1212      Lab Results  Component Value Date   TSH 1.51 05/18/2023   TSH 0.01 (L) 09/16/2019   TSH 0.19 (L) 04/16/2019   TSH 2.09 10/11/2016           Assessment & Plan:   1. Hypoglycemia (Primary)  - AUDRIA TAKESHITA  is being seen at a kind request of Wendi Ham, NP. - I have reviewed her available records and clinically evaluated the patient.  - Based on these reviews, she has reactive hypoglycemia, however, there is not sufficient information to proceed with definitive treatment plan.  Will check insulin and c-peptide levels as well as fasting glucose.  Her hypoglycemia could be as a result of recent gastric bypass surgery (noninsulinoma pancreateogenous hypoglycemia-NIPH), where in some cases partial pancreatectomy may be required vs reactive hypoglycemia, vs pancreatic insulinoma.  She is advised to continue using her CGM to help detect early hypoglycemia.  We did go over lifestyle changes to help prevent hypoglycemia.  The following Lifestyle  Medicine recommendations according to American College of Lifestyle Medicine  [facility]) were discussed and offered to patient and she agrees to start the journey:  A. Whole Foods, Plant-based plate comprising of fruits and vegetables, plant-based proteins, whole-grain carbohydrates was discussed in detail with the patient.   A list for source of those nutrients were also provided to the patient.  Patient will use only water  or unsweetened tea for hydration. B.  The need to stay away from risky substances including alcohol, smoking; obtaining 7 to 9 hours of restorative sleep, at least 150 minutes of moderate intensity exercise weekly, the importance of healthy social connections,  and stress reduction techniques were discussed. C.  A full color page of  Calorie density of various food groups per pound showing examples of each food groups was provided to the patient.  - I did not initiate any new prescriptions today.  - she is advised to maintain close follow up with Wendi Ham, NP for primary care needs.     I spent  60  minutes in the care of the patient today including review of labs from CMP, Lipids, Thyroid  Function, Hematology (current and previous including abstractions from other facilities); face-to-face time discussing  her blood glucose readings/logs, discussing hypoglycemia and hyperglycemia episodes and symptoms, medications doses, her options of short and long term treatment based on the latest standards of care / guidelines;  discussion about incorporating lifestyle medicine;  and documenting the encounter. Risk reduction counseling performed per USPSTF guidelines to reduce obesity and cardiovascular risk factors.     Please refer to Patient Instructions for Blood Glucose Monitoring and Insulin/Medications Dosing Guide"  in media tab for additional information. Please  also refer to " Patient Self Inventory" in the Media  tab for reviewed elements of pertinent patient history.  Wendy Watkins participated in the discussions, expressed understanding, and voiced  agreement with the above plans.  All questions were answered to her satisfaction. she is encouraged to contact clinic should she have any questions or concerns prior to her return visit.  Follow up plan: Return in about 4 months (around 04/04/2024), or will call with lab results and next steps., for reactive hypoglycemia follow up.   Hulon Magic, [facility] [address] [place], [place] [address] Phone: [phone] Fax: [phone]   12/04/2023, 2:12 PM

## 2023-12-06 DIAGNOSIS — E162 Hypoglycemia, unspecified: Secondary | ICD-10-CM | POA: Diagnosis not present

## 2023-12-07 ENCOUNTER — Ambulatory Visit: Payer: Self-pay | Admitting: Nurse Practitioner

## 2023-12-07 LAB — COMPREHENSIVE METABOLIC PANEL WITH GFR
ALT: 12 IU/L (ref 0–32)
AST: 18 IU/L (ref 0–40)
Albumin: 4.5 g/dL (ref 3.9–4.9)
Alkaline Phosphatase: 129 IU/L — ABNORMAL HIGH (ref 44–121)
BUN/Creatinine Ratio: 17 (ref 9–23)
BUN: 15 mg/dL (ref 6–24)
Bilirubin Total: 0.5 mg/dL (ref 0.0–1.2)
CO2: 23 mmol/L (ref 20–29)
Calcium: 9.6 mg/dL (ref 8.7–10.2)
Chloride: 101 mmol/L (ref 96–106)
Creatinine, Ser: 0.87 mg/dL (ref 0.57–1.00)
Globulin, Total: 2.4 g/dL (ref 1.5–4.5)
Glucose: 97 mg/dL (ref 70–99)
Potassium: 4.3 mmol/L (ref 3.5–5.2)
Sodium: 139 mmol/L (ref 134–144)
Total Protein: 6.9 g/dL (ref 6.0–8.5)
eGFR: 83 mL/min/{1.73_m2} (ref >59)

## 2023-12-07 LAB — INSULIN AND C-PEPTIDE, SERUM
C-Peptide: 1.6 ng/mL (ref 1.1–4.4)
INSULIN: 6.4 u[IU]/mL (ref 2.6–24.9)

## 2023-12-07 NOTE — Telephone Encounter (Signed)
 Noted, Wendy Watkins has sent the patient a MyChart message going over her recommendation.

## 2023-12-07 NOTE — Telephone Encounter (Signed)
-----   Message from Wendel Hals sent at 12/07/2023  9:44 AM EDT ----- Wendy Watkins: I sent mychart message going over recent labs.

## 2023-12-07 NOTE — Progress Notes (Signed)
FYI: I sent mychart message going over recent labs.

## 2023-12-08 DIAGNOSIS — Z1211 Encounter for screening for malignant neoplasm of colon: Secondary | ICD-10-CM | POA: Diagnosis not present

## 2023-12-09 LAB — PREGNANCY, URINE: Preg Test, Ur: NEGATIVE

## 2023-12-11 ENCOUNTER — Encounter (HOSPITAL_COMMUNITY)
Admission: RE | Disposition: A | Discharge status: Home / Self Care | Payer: Self-pay | Source: Home / Self Care | Attending: Internal Medicine

## 2023-12-11 ENCOUNTER — Encounter (HOSPITAL_COMMUNITY): Payer: Self-pay | Admitting: Internal Medicine

## 2023-12-11 ENCOUNTER — Ambulatory Visit (HOSPITAL_COMMUNITY): Admitting: Anesthesiology

## 2023-12-11 ENCOUNTER — Other Ambulatory Visit: Payer: Self-pay

## 2023-12-11 ENCOUNTER — Ambulatory Visit (HOSPITAL_COMMUNITY)
Admission: RE | Admit: 2023-12-11 | Discharge: 2023-12-11 | Disposition: A | Discharge status: Home / Self Care | Attending: Internal Medicine | Admitting: Internal Medicine

## 2023-12-11 DIAGNOSIS — Z79899 Other long term (current) drug therapy: Secondary | ICD-10-CM | POA: Diagnosis not present

## 2023-12-11 DIAGNOSIS — Z139 Encounter for screening, unspecified: Secondary | ICD-10-CM | POA: Diagnosis not present

## 2023-12-11 DIAGNOSIS — D120 Benign neoplasm of cecum: Secondary | ICD-10-CM

## 2023-12-11 DIAGNOSIS — E785 Hyperlipidemia, unspecified: Secondary | ICD-10-CM | POA: Insufficient documentation

## 2023-12-11 DIAGNOSIS — Z1211 Encounter for screening for malignant neoplasm of colon: Secondary | ICD-10-CM | POA: Diagnosis not present

## 2023-12-11 DIAGNOSIS — K449 Diaphragmatic hernia without obstruction or gangrene: Secondary | ICD-10-CM | POA: Insufficient documentation

## 2023-12-11 DIAGNOSIS — K219 Gastro-esophageal reflux disease without esophagitis: Secondary | ICD-10-CM | POA: Insufficient documentation

## 2023-12-11 DIAGNOSIS — E039 Hypothyroidism, unspecified: Secondary | ICD-10-CM | POA: Insufficient documentation

## 2023-12-11 DIAGNOSIS — E282 Polycystic ovarian syndrome: Secondary | ICD-10-CM | POA: Insufficient documentation

## 2023-12-11 DIAGNOSIS — K635 Polyp of colon: Secondary | ICD-10-CM | POA: Diagnosis not present

## 2023-12-11 HISTORY — PX: COLONOSCOPY: SHX5424

## 2023-12-11 SURGERY — COLONOSCOPY
Anesthesia: General

## 2023-12-11 MED ORDER — PROPOFOL 500 MG/50ML IV EMUL
INTRAVENOUS | Status: DC | PRN
Start: 2023-12-11 — End: 2023-12-11
  Administered 2023-12-11: 200 ug/kg/min via INTRAVENOUS

## 2023-12-11 MED ORDER — PROPOFOL 10 MG/ML IV BOLUS
INTRAVENOUS | Status: DC | PRN
Start: 2023-12-11 — End: 2023-12-11
  Administered 2023-12-11: 100 mg via INTRAVENOUS

## 2023-12-11 MED ORDER — LACTATED RINGERS IV SOLN: INTRAVENOUS | Status: DC | PRN

## 2023-12-11 NOTE — H&P (Signed)
 @LOGO @   Primary Care Physician:  Wendi Ham, NP Primary Gastroenterologist:  Dr. Riley Cheadle  Pre-Procedure History & Physical: HPI:  Wendy Watkins is a 48 y.o. female here for here for first-ever average risk screening colonoscopy. No bowel symptoms. Past Medical History:  Diagnosis Date   Chiari I malformation (HCC)    Eczema    GERD (gastroesophageal reflux disease)    Heavy menstrual period    History of iron deficiency anemia    Hyperlipidemia    Hypothyroidism    Obesity    PCOS (polycystic ovarian syndrome)    PMS (premenstrual syndrome) 10/11/2016   Urge incontinence    Vitamin D deficiency    Wears contact lenses    Wears glasses    Wears glasses     Past Surgical History:  Procedure Laterality Date   CALCANEAL OSTEOTOMY Left 12/05/2018   Procedure: EVANCALCANEAL OSTEOTOMY;  Surgeon: Charity Conch, DPM;  Location: [facility];  Service: Podiatry;  Laterality: Left;   CALCANEAL OSTEOTOMY Right 05/08/2019   Procedure: EVAN CALCANEAL OSTEOTOMY;  Surgeon: Charity Conch, DPM;  Location: [facility];  Service: Podiatry;  Laterality: Right;   FLAT FOOT RECONSTRUCTION-TAL GASTROC RECESSION Right 05/08/2019   Procedure: FLAT FOOT RECONSTRUCTION-TAL GASTROC RECESSION;  Surgeon: Charity Conch, DPM;  Location: [facility] [place];  Service: Podiatry;  Laterality: Right;   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/2021   Procedure: LAPAROSCOPIC ROUX-EN-Y GASTRIC BYPASS WITH UPPER ENDOSCOPY;  Surgeon: Adalberto Acton, MD;  Location[facility];  Service: General;  Laterality: N/A;   GASTROC RECESSION EXTREMITY Left 12/05/2018   Procedure: GASTROC RECESSION EXTREMITY;  Surgeon: Charity Conch, DPM;  Location: [facility];  Service: Podiatry;  Laterality: Left;   HIATAL HERNIA REPAIR N/A 10/11/2021   Procedure: HERNIA REPAIR HIATAL;  Surgeon: Adalberto Acton, MD;  Location: [facility];  Service: General;  Laterality: N/A;   OSTECTOMY  Left 12/05/2018   Procedure: COTTON OSTEOTOMY, MEDIAL CALCANEAL SLIDE OSTEOTOMY, MANIPULATION OF THE ANKLE UNDER ANESTHESIA;  Surgeon: Charity Conch, DPM;  Location: [facility];  Service: Podiatry;  Laterality: Left;   STERIOD INJECTION Left 05/08/2019   Procedure: STEROID INJECTION;  Surgeon: Charity Conch, DPM;  Location: [facility] [place];  Service: Podiatry;  Laterality: Left;   TUBAL LIGATION  2003   UPPER GI ENDOSCOPY N/A 10/11/2021   Procedure: UPPER GI ENDOSCOPY;  Surgeon: Adalberto Acton, MD;  Location: [facility];  Service: General;  Laterality: N/A;   WISDOM TOOTH EXTRACTION      Prior to Admission medications   Medication Sig Start Date End Date Taking? Authorizing Provider  FLUoxetine  (PROZAC ) 20 MG capsule Take 1 capsule by mouth once daily 12/28/20  Yes Gosrani, Nimish C, MD  clobetasol  ointment (TEMOVATE ) 0.05 % APPLY OINTMENT TOPICALLY TO SKIN TWICE DAILY FOR 14 DAYS Patient taking differently: Apply 1 application  topically daily as needed (eczema). 08/03/21   Zorita Hiss, NP  Continuous Glucose Sensor (FREESTYLE LIBRE 3 PLUS SENSOR) MISC Use as directed. 11/01/23     levonorgestrel  (MIRENA ) 20 MCG/24HR IUD by Intrauterine route.    [provider]    Allergies as of 11/08/2023 - Review Complete 10/30/2023  Allergen Reaction Noted   Other Anaphylaxis 11/30/2018    Family History  Problem Relation Age of Onset   Hyperlipidemia Mother    Hypertension Mother    Arthritis Father    Cancer Father  lymphoma   Heart disease Maternal Grandmother        CHF   Hyperlipidemia Maternal Grandmother    Hypertension Maternal Grandmother    Diabetes Maternal Grandmother    Cancer Paternal Grandmother        ovarian   Kidney disease Paternal Grandfather    Alcohol abuse Paternal Grandfather     Social History   Socioeconomic History   Marital status: Married    Spouse name: Margaretmary Shaver   Number of children: 3   Years of education:  14   Highest education level: Not on file  Occupational History   Occupation: stay at home    Comment: husband is retired  Tobacco Use   Smoking status: Never   Smokeless tobacco: Never  Vaping Use   Vaping status: Never Used  Substance and Sexual Activity   Alcohol use: Yes    Comment: socially wine   Drug use: No   Sexual activity: Yes    Birth control/protection: Surgical  Other Topics Concern   Not on file  Social History Narrative   Husband Margaretmary Shaver - Hotel manager - Forensic scientist retired with PTSD   3 children at home in [employer] is in college- psychology   Tries to exercise   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 Lungs:  Clear throughout to auscultation.   No wheezes, crackles, or rhonchi. No acute distress. Heart:  Regular rate and rhythm; no murmurs, clicks, rubs,  or gallops. Abdomen: Non-distended, normal bowel sounds.  Soft and nontender without appreciable mass or hepatosplenomegaly.   Impression/Plan: 48 year old lady here for first-ever average risk screening colonoscopy.. The risks, benefits, limitations, alternatives and imponderables have been reviewed with the patient. Questions have been answered. All parties are agreeable.       Notice: This dictation was prepared with Dragon dictation along with smaller phrase technology. Any transcriptional errors that result from this process are unintentional and may not be corrected upon review.

## 2023-12-11 NOTE — OR Nursing (Incomplete)
 Wendy Watkins was at [facility] on 12/11/23 and cannot return to work until 3:00PM on 12/12/23.

## 2023-12-11 NOTE — Transfer of Care (Signed)
 Immediate Anesthesia Transfer of Care Note  Patient: Wendy Watkins  Procedure(s) Performed: COLONOSCOPY  Patient Location: Endoscopy Unit  Anesthesia Type:General  Level of Consciousness: awake, alert , oriented, and patient cooperative  Airway & Oxygen Therapy: Patient Spontanous Breathing  Post-op Assessment: Report given to RN, Post -op Vital signs reviewed and stable, and Patient moving all extremities X 4  Post vital signs: Reviewed and stable  Last Vitals:  Vitals Value Taken Time  BP 99/60 12/11/23 1314  Temp 36.5 C 12/11/23 1314  Pulse 83 12/11/23 1314  Resp 20 12/11/23 1314  SpO2 100 % 12/11/23 1314    Last Pain:  Vitals:   12/11/23 1314  TempSrc: Oral  PainSc: 0-No pain      Patients Stated Pain Goal: 7 (12/11/23 1006)  Complications: No notable events documented.

## 2023-12-11 NOTE — Anesthesia Preprocedure Evaluation (Signed)
 Anesthesia Evaluation  Patient identified by MRN, date of birth, ID band Patient awake    Reviewed: Allergy & Precautions, NPO status , Patient's Chart, lab work & pertinent test results, reviewed documented beta blocker date and time   Airway Mallampati: III  TM Distance: >3 FB Neck ROM: Full    Dental no notable dental hx. (+) Teeth Intact, Dental Advisory Given   Pulmonary  Snores    Pulmonary exam normal breath sounds clear to auscultation       Cardiovascular negative cardio ROS Normal cardiovascular exam Rhythm:Regular Rate:Normal     Neuro/Psych  Headaches Hx/o Chiari I malformation minimal herniation, no surgery recommended  negative psych ROS   GI/Hepatic Neg liver ROS, hiatal hernia,GERD  Medicated and Controlled,,  Endo/Other  Hypothyroidism  Hyperlipidemia PCOS  Renal/GU negative Renal ROS  negative genitourinary   Musculoskeletal Eczema   Abdominal  (+) + obese  Peds  Hematology  (+) Blood dyscrasia, anemia   Anesthesia Other Findings   Reproductive/Obstetrics                             Anesthesia Physical Anesthesia Plan  ASA: 2  Anesthesia Plan: General   Post-op Pain Management: Minimal or no pain anticipated   Induction: Intravenous  PONV Risk Score and Plan:   Airway Management Planned: Nasal Cannula and Natural Airway  Additional Equipment: None  Intra-op Plan:   Post-operative Plan:   Informed Consent: I have reviewed the patients History and Physical, chart, labs and discussed the procedure including the risks, benefits and alternatives for the proposed anesthesia with the patient or authorized representative who has indicated his/her understanding and acceptance.     Dental advisory given  Plan Discussed with: CRNA  Anesthesia Plan Comments:         Anesthesia Quick Evaluation

## 2023-12-11 NOTE — Anesthesia Postprocedure Evaluation (Signed)
 Anesthesia Post Note  Patient: Wendy Watkins  Procedure(s) Performed: COLONOSCOPY  Patient location during evaluation: Endoscopy Anesthesia Type: General Level of consciousness: awake and alert Pain management: pain level controlled Vital Signs Assessment: post-procedure vital signs reviewed and stable Respiratory status: spontaneous breathing, nonlabored ventilation, respiratory function stable and patient connected to nasal cannula oxygen Cardiovascular status: blood pressure returned to baseline and stable Postop Assessment: no apparent nausea or vomiting Anesthetic complications: no   There were no known notable events for this encounter.   Last Vitals:  Vitals:   12/11/23 1006 12/11/23 1314  BP: 111/76 99/60  Pulse: 64 83  Resp: 15 20  Temp: 36.7 C 36.5 C  SpO2: 100% 100%    Last Pain:  Vitals:   12/11/23 1314  TempSrc: Oral  PainSc: 0-No pain                 Oreatha Fabry L Srinivas Lippman

## 2023-12-11 NOTE — Discharge Instructions (Addendum)
  Colonoscopy Discharge Instructions  Read the instructions outlined below and refer to this sheet in the next few weeks. These discharge instructions provide you with general information on caring for yourself after you leave the hospital. Your doctor may also give you specific instructions. While your treatment has been planned according to the most current medical practices available, unavoidable complications occasionally occur. If you have any problems or questions after discharge, call Dr. Riley Cheadle at [phone]. ACTIVITY You may resume your regular activity, but move at a slower pace for the next 24 hours.  Take frequent rest periods for the next 24 hours.  Walking will help get rid of the air and reduce the bloated feeling in your belly (abdomen).  No driving for 24 hours (because of the medicine (anesthesia) used during the test).   Do not sign any important legal documents or operate any machinery for 24 hours (because of the anesthesia used during the test).  NUTRITION Drink plenty of fluids.  You may resume your normal diet as instructed by your doctor.  Begin with a light meal and progress to your normal diet. Heavy or fried foods are harder to digest and may make you feel sick to your stomach (nauseated).  Avoid alcoholic beverages for 24 hours or as instructed.  MEDICATIONS You may resume your normal medications unless your doctor tells you otherwise.  WHAT YOU CAN EXPECT TODAY Some feelings of bloating in the abdomen.  Passage of more gas than usual.  Spotting of blood in your stool or on the toilet paper.  IF YOU HAD POLYPS REMOVED DURING THE COLONOSCOPY: No aspirin products for 7 days or as instructed.  No alcohol for 7 days or as instructed.  Eat a soft diet for the next 24 hours.  FINDING OUT THE RESULTS OF YOUR TEST Not all test results are available during your visit. If your test results are not back during the visit, make an appointment with your caregiver to find out the  results. Do not assume everything is normal if you have not heard from your caregiver or the medical facility. It is important for you to follow up on all of your test results.  SEEK IMMEDIATE MEDICAL ATTENTION IF: You have more than a spotting of blood in your stool.  Your belly is swollen (abdominal distention).  You are nauseated or vomiting.  You have a temperature over 101.  You have abdominal pain or discomfort that is severe or gets worse throughout the day.      1 small polyp found and removed  Further recommendations to follow pending review of pathology report

## 2023-12-11 NOTE — Op Note (Signed)
 [facility] Patient Name: Wendy Watkins Procedure Date: 12/11/2023 11:38 AM MRN: [id number] Date of Birth: [birth year] Attending MD: Gemma Kelp , MD, [id number] CSN: [id number] Age: 48 Admit Type: Outpatient Procedure:                Colonoscopy Indications:              Screening for colorectal malignant neoplasm Providers:                Gemma Kelp, MD, Vonna Guardian, Theola Fitch Referring MD:              Medicines:                Propofol  per Anesthesia Complications:            No immediate complications. Estimated Blood Loss:     Estimated blood loss was minimal. Procedure:                Pre-Anesthesia Assessment:                           - Prior to the procedure, a History and Physical                            was performed, and patient medications and                            allergies were reviewed. The patient's tolerance of                            previous anesthesia was also reviewed. The risks                            and benefits of the procedure and the sedation                            options and risks were discussed with the patient.                            All questions were answered, and informed consent                            was obtained. Prior Anticoagulants: The patient has                            taken no anticoagulant or antiplatelet agents. ASA                            Grade Assessment: II - A patient with mild systemic                            disease. After reviewing the risks and benefits,                            the patient was deemed in satisfactory condition to  undergo the procedure.                           After obtaining informed consent, the colonoscope                            was passed under direct vision. Throughout the                            procedure, the patient's blood pressure, pulse, and                            oxygen saturations were monitored  continuously. The                            [id number]) scope was introduced through the                            anus and advanced to the the cecum, identified by                            appendiceal orifice and ileocecal valve. The                            colonoscopy was performed without difficulty. The                            patient tolerated the procedure well. The quality                            of the bowel preparation was adequate. The                            ileocecal valve, appendiceal orifice, and rectum                            were photographed. Scope In: 12:58:13 PM Scope Out: 1:10:20 PM Scope Withdrawal Time: 0 hours 8 minutes 42 seconds  Total Procedure Duration: 0 hours 12 minutes 7 seconds  Findings:      The perianal and digital rectal examinations were normal.      A 2 mm polyp was found in the cecum. The polyp was sessile. The polyp       was removed with a cold snare. Resection and retrieval were complete.       Estimated blood loss was minimal.      The exam was otherwise without abnormality on direct and retroflexion       views. Impression:               - One 2 mm polyp in the cecum, removed with a cold                            snare. Resected and retrieved.                           - The  examination was otherwise normal on direct                            and retroflexion views. Moderate Sedation:      Moderate (conscious) sedation was personally administered by an       anesthesia professional. The following parameters were monitored: oxygen       saturation, heart rate, blood pressure, respiratory rate, EKG, adequacy       of pulmonary ventilation, and response to care. Recommendation:           - Patient has a contact number available for                            emergencies. The signs and symptoms of potential                            delayed complications were discussed with the                            patient.  Return to normal activities tomorrow.                            Written discharge instructions were provided to the                            patient.                           - Resume previous diet.                           - Continue present medications.                           - Repeat colonoscopy date to be determined after                            pending pathology results are reviewed for                            surveillance.                           - Return to GI office (date not yet determined). Procedure Code(s):        --- Professional ---                           [phone], Colonoscopy, flexible; with removal of                            tumor(s), polyp(s), or other lesion(s) by snare                            technique Diagnosis Code(s):        --- Professional ---  Z12.11, Encounter for screening for malignant                            neoplasm of colon                           D12.0, Benign neoplasm of cecum CPT copyright 2022 American Medical Association. All rights reserved. The codes documented in this report are preliminary and upon coder review may  be revised to meet current compliance requirements. Windsor Hatcher. Namine Beahm, MD Gemma Kelp, MD 12/11/2023 1:16:50 PM This report has been signed electronically. Number of Addenda: 0

## 2023-12-12 ENCOUNTER — Encounter (HOSPITAL_COMMUNITY): Payer: Self-pay | Admitting: Internal Medicine

## 2023-12-12 ENCOUNTER — Ambulatory Visit: Payer: Self-pay | Admitting: Internal Medicine

## 2023-12-12 LAB — SURGICAL PATHOLOGY

## 2023-12-25 ENCOUNTER — Other Ambulatory Visit (HOSPITAL_COMMUNITY): Payer: Self-pay

## 2023-12-25 ENCOUNTER — Other Ambulatory Visit (HOSPITAL_COMMUNITY): Payer: Self-pay | Admitting: Obstetrics & Gynecology

## 2023-12-25 ENCOUNTER — Encounter (HOSPITAL_COMMUNITY): Payer: Self-pay

## 2023-12-25 DIAGNOSIS — Z1231 Encounter for screening mammogram for malignant neoplasm of breast: Secondary | ICD-10-CM

## 2023-12-28 ENCOUNTER — Other Ambulatory Visit (HOSPITAL_COMMUNITY): Payer: Self-pay | Admitting: Women's Health

## 2023-12-28 ENCOUNTER — Other Ambulatory Visit (HOSPITAL_COMMUNITY): Payer: Self-pay | Admitting: Obstetrics & Gynecology

## 2023-12-28 DIAGNOSIS — Z1231 Encounter for screening mammogram for malignant neoplasm of breast: Secondary | ICD-10-CM

## 2024-01-01 ENCOUNTER — Ambulatory Visit (HOSPITAL_COMMUNITY)
Admission: RE | Admit: 2024-01-01 | Discharge: 2024-01-01 | Disposition: A | Discharge status: Home / Self Care | Source: Ambulatory Visit

## 2024-01-01 DIAGNOSIS — Z1231 Encounter for screening mammogram for malignant neoplasm of breast: Secondary | ICD-10-CM | POA: Diagnosis not present

## 2024-01-06 ENCOUNTER — Encounter (HOSPITAL_COMMUNITY): Payer: Self-pay | Admitting: *Deleted

## 2024-01-09 ENCOUNTER — Other Ambulatory Visit (HOSPITAL_COMMUNITY): Payer: Self-pay

## 2024-01-10 ENCOUNTER — Inpatient Hospital Stay
Admission: RE | Admit: 2024-01-10 | Discharge: 2024-01-10 | Disposition: A | Discharge status: Home / Self Care | Payer: Self-pay | Source: Ambulatory Visit | Attending: Women's Health | Admitting: Women's Health

## 2024-01-10 ENCOUNTER — Other Ambulatory Visit (HOSPITAL_COMMUNITY): Payer: Self-pay | Admitting: Women's Health

## 2024-01-10 DIAGNOSIS — Z1231 Encounter for screening mammogram for malignant neoplasm of breast: Secondary | ICD-10-CM

## 2024-01-11 ENCOUNTER — Other Ambulatory Visit (HOSPITAL_COMMUNITY): Payer: Self-pay | Admitting: Women's Health

## 2024-01-11 ENCOUNTER — Encounter (HOSPITAL_COMMUNITY): Payer: Self-pay | Admitting: Women's Health

## 2024-01-11 DIAGNOSIS — R928 Other abnormal and inconclusive findings on diagnostic imaging of breast: Secondary | ICD-10-CM

## 2024-01-24 ENCOUNTER — Ambulatory Visit: Payer: Self-pay | Admitting: Nurse Practitioner

## 2024-01-25 ENCOUNTER — Other Ambulatory Visit: Payer: Self-pay | Admitting: Medical Genetics

## 2024-01-26 ENCOUNTER — Other Ambulatory Visit (HOSPITAL_COMMUNITY)
Admission: RE | Admit: 2024-01-26 | Discharge: 2024-01-26 | Disposition: A | Discharge status: Home / Self Care | Payer: Self-pay | Source: Ambulatory Visit | Attending: Medical Genetics | Admitting: Medical Genetics

## 2024-01-26 ENCOUNTER — Other Ambulatory Visit (HOSPITAL_COMMUNITY): Payer: Self-pay

## 2024-01-30 ENCOUNTER — Ambulatory Visit (HOSPITAL_COMMUNITY)
Admission: RE | Admit: 2024-01-30 | Discharge: 2024-01-30 | Disposition: A | Discharge status: Home / Self Care | Source: Ambulatory Visit | Attending: Women's Health | Admitting: Women's Health

## 2024-01-30 DIAGNOSIS — R928 Other abnormal and inconclusive findings on diagnostic imaging of breast: Secondary | ICD-10-CM | POA: Diagnosis not present

## 2024-01-30 DIAGNOSIS — N6001 Solitary cyst of right breast: Secondary | ICD-10-CM | POA: Diagnosis not present

## 2024-01-30 DIAGNOSIS — R92333 Mammographic heterogeneous density, bilateral breasts: Secondary | ICD-10-CM | POA: Diagnosis not present

## 2024-01-30 DIAGNOSIS — N6310 Unspecified lump in the right breast, unspecified quadrant: Secondary | ICD-10-CM | POA: Diagnosis not present

## 2024-02-04 LAB — GENECONNECT MOLECULAR SCREEN: Genetic Analysis Overall Interpretation: NEGATIVE

## 2024-02-16 ENCOUNTER — Other Ambulatory Visit (HOSPITAL_COMMUNITY): Payer: Self-pay

## 2024-03-13 ENCOUNTER — Other Ambulatory Visit (HOSPITAL_COMMUNITY): Payer: Self-pay

## 2024-04-08 ENCOUNTER — Ambulatory Visit (INDEPENDENT_AMBULATORY_CARE_PROVIDER_SITE_OTHER): Admitting: Nurse Practitioner

## 2024-04-08 ENCOUNTER — Other Ambulatory Visit: Payer: Self-pay

## 2024-04-08 ENCOUNTER — Encounter: Payer: Self-pay | Admitting: Nurse Practitioner

## 2024-04-08 VITALS — BP 106/70 | HR 67 | Ht 61.0 in | Wt 178.4 lb

## 2024-04-08 DIAGNOSIS — E162 Hypoglycemia, unspecified: Secondary | ICD-10-CM | POA: Diagnosis not present

## 2024-04-08 MED ORDER — ZEPBOUND 2.5 MG/0.5ML ~~LOC~~ SOAJ: 2.5000 mg | SUBCUTANEOUS | 0 refills | Status: DC

## 2024-04-08 MED ORDER — ZEPBOUND 5 MG/0.5ML ~~LOC~~ SOAJ: 5.0000 mg | SUBCUTANEOUS | 0 refills | Status: DC

## 2024-04-08 NOTE — Progress Notes (Signed)
 Endocrinology Follow Up Note                                            04/08/2024, 4:38 PM   Subjective:    Patient ID: Wendy Watkins, female    DOB: [birth year], PCP Hyacinth Honey, NP   Past Medical History:  Diagnosis Date   Chiari I malformation (HCC)    Eczema    GERD (gastroesophageal reflux disease)    Heavy menstrual period    History of iron deficiency anemia    Hyperlipidemia    Hypothyroidism    Obesity    PCOS (polycystic ovarian syndrome)    PMS (premenstrual syndrome) 10/11/2016   Urge incontinence    Vitamin D deficiency    Wears contact lenses    Wears glasses    Wears glasses    Past Surgical History:  Procedure Laterality Date   CALCANEAL OSTEOTOMY Left 12/05/2018   Procedure: EVANCALCANEAL OSTEOTOMY;  Surgeon: Gershon Donnice SAUNDERS, DPM;  Location: [facility];  Service: Podiatry;  Laterality: Left;   CALCANEAL OSTEOTOMY Right 05/08/2019   Procedure: EVAN CALCANEAL OSTEOTOMY;  Surgeon: Gershon Donnice SAUNDERS, DPM;  Location: [facility];  Service: Podiatry;  Laterality: Right;   COLONOSCOPY N/A 12/11/2023   Procedure: COLONOSCOPY;  Surgeon: Shaaron Lamar HERO, MD;  [facility];  Service: Endoscopy;  Laterality: N/A;  9:45 am, asa 2   FLAT FOOT RECONSTRUCTION-TAL GASTROC RECESSION Right 05/08/2019   Procedure: FLAT FOOT RECONSTRUCTION-TAL GASTROC RECESSION;  Surgeon: Gershon Donnice SAUNDERS, DPM;  Location: [facility] [place];  Service: Podiatry;  Laterality: Right;   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/2021   Procedure: LAPAROSCOPIC ROUX-EN-Y GASTRIC BYPASS WITH UPPER ENDOSCOPY;  Surgeon: Signe Mitzie LABOR, MD;  Location: [facility];  Service: General;  Laterality: N/A;   GASTROC RECESSION EXTREMITY Left 12/05/2018   Procedure: GASTROC RECESSION EXTREMITY;  Surgeon: Gershon Donnice SAUNDERS, DPM;  Location: [facility] [place];  Service: Podiatry;  Laterality: Left;   HIATAL HERNIA REPAIR N/A 10/11/2021   Procedure: HERNIA  REPAIR HIATAL;  Surgeon: Signe Mitzie LABOR, MD;  Location: [facility];  Service: General;  Laterality: N/A;   OSTECTOMY Left 12/05/2018   Procedure: COTTON OSTEOTOMY, MEDIAL CALCANEAL SLIDE OSTEOTOMY, MANIPULATION OF THE ANKLE UNDER ANESTHESIA;  Surgeon: Gershon Donnice SAUNDERS, DPM;  Location: [facility] [place];  Service: Podiatry;  Laterality: Left;   STERIOD INJECTION Left 05/08/2019   Procedure: STEROID INJECTION;  Surgeon: Gershon Donnice SAUNDERS, DPM;  Location: [facility] [place];  Service: Podiatry;  Laterality: Left;   TUBAL LIGATION  2003   UPPER GI ENDOSCOPY N/A 10/11/2021   Procedure: UPPER GI ENDOSCOPY;  Surgeon: Signe Mitzie LABOR, MD;  Location: [facility];  Service: General;  Laterality: N/A;   WISDOM TOOTH EXTRACTION     Social History   Socioeconomic History   Marital status: Married    Spouse name: Omar   Number of children: 3   Years of education: 14   Highest education Watkins: Not on file  Occupational History   Occupation: stay at home    Comment: husband is retired  Tobacco Use   Smoking status: Never   Smokeless tobacco: Never  Vaping Use   Vaping status: Never Used  Substance and Sexual Activity   Alcohol use: Yes    Comment: socially wine   Drug  use: No   Sexual activity: Yes    Birth control/protection: Surgical  Other Topics Concern   Not on file  Social History Narrative   Husband Omar - Dealer retired with PTSD   3 children at home in [employer] [employer] is in college- psychology   Tries to exercise   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   Social Drivers of Corporate investment banker Strain: Not on [employer] Insecurity: Not on file  Transportation Needs: Not on file  Physical Activity: Not on file  Stress: Not on file  Social Connections: Not on file   Family History  Problem Relation Age of Onset   Hyperlipidemia Mother    Hypertension Mother    Arthritis Father    Cancer Father        lymphoma   Heart disease Maternal Grandmother        CHF    Hyperlipidemia Maternal Grandmother    Hypertension Maternal Grandmother    Diabetes Maternal Grandmother    Cancer Paternal Grandmother        ovarian   Kidney disease Paternal Grandfather    Alcohol abuse Paternal Grandfather    Outpatient Encounter Medications as of 04/08/2024  Medication Sig   clobetasol  ointment (TEMOVATE ) 0.05 % APPLY OINTMENT TOPICALLY TO SKIN TWICE DAILY FOR 14 DAYS (Patient taking differently: Apply 1 application  topically daily as needed (eczema). Patient uses as needed)   Continuous Glucose Sensor (FREESTYLE LIBRE 3 PLUS SENSOR) MISC Use as directed.   levonorgestrel  (MIRENA ) 20 MCG/24HR IUD by Intrauterine route.   tirzepatide (ZEPBOUND) 2.5 MG/0.5ML Pen Inject 2.5 mg into the skin once a week.   [START ON 05/06/2024] tirzepatide (ZEPBOUND) 5 MG/0.5ML Pen Inject 5 mg into the skin once a week.   FLUoxetine  (PROZAC ) 20 MG capsule Take 1 capsule by mouth once daily   No facility-administered encounter medications on file as of 04/08/2024.   ALLERGIES: Allergies  Allergen Reactions   Other Anaphylaxis    Peaches: throat swelling   Doxycycline Nausea Only    VACCINATION STATUS: Immunization History  Administered Date(s) Administered   Influenza-Unspecified 02/26/2019   PFIZER(Purple Top)SARS-COV-2 Vaccination 09/08/2019, 09/29/2019   Pfizer Covid-19 Vaccine Bivalent Booster 9yrs & up 11/27/2020   Pfizer Sars-cov-2 Pediatric Vaccine(48mos to <35yrs) 09/29/2019   Tetanus 04/16/2019    HPI Wendy Watkins is 48 y.o. female who presents today with a medical history as above. she is being seen in follow up after being seen in consultation for hypoglycemia requested by Hyacinth Honey, NP.  she has been dealing with symptoms of shakiness, sweatiness, mental fog for a few months now.  She notes her symptoms are worse after eating, although she does have some drops in glucose at night as well.  Her PCP did prescribe a CGM.  Her TIR is 85%, TAR 2%, TBR 13%  with a GMI of 5.5%.  She notes she drinks mostly water , will have coffee with splenda and creamer in the mornings.  She eats pretty much what she wants at this stage, just in lower quantities.  She does maintain an active lifestyle, walking 45 minutes on average daily.  she has history of gastric bypass surgery in 2023.  She notes her dad did have lymphoma which resulted in him having a partial pancreatectomy and splenectomy in the past.  Review of systems  Constitutional: + Minimally fluctuating body weight,  current Body mass index is 33.71 kg/m. , + fatigue, no subjective hyperthermia, no  subjective hypothermia Eyes: no blurry vision, no xerophthalmia ENT: no sore throat, no nodules palpated in throat, no dysphagia/odynophagia, no hoarseness Cardiovascular: no chest pain, no shortness of breath, no palpitations, no leg swelling Respiratory: no cough, no shortness of breath Gastrointestinal: no nausea/vomiting/diarrhea Musculoskeletal: no muscle/joint aches Skin: no rashes, no hyperemia Neurological: no tremors, no numbness, no tingling, no dizziness Psychiatric: no depression, no anxiety  Objective:        04/08/2024    4:12 PM 12/11/2023    1:14 PM 12/11/2023   10:06 AM  Vitals with BMI  Height 5' 1    Weight 178 lbs 6 oz    BMI 33.73    Systolic 106 99 111  Diastolic 70 60 76  Pulse 67 83 64    BP 106/70 (BP Location: Left Arm, Patient Position: Sitting, Cuff Size: Large)   Pulse 67   Ht 5' 1 (1.549 m)   Wt 178 lb 6.4 oz (80.9 kg)   BMI 33.71 kg/m   Wt Readings from Last 3 Encounters:  04/08/24 178 lb 6.4 oz (80.9 kg)  12/04/23 176 lb 3.2 oz (79.9 kg)  12/07/21 217 lb 9.6 oz (98.7 kg)     Physical Exam- Limited  Constitutional:  Body mass index is 33.71 kg/m. , not in acute distress, normal state of mind Eyes:  EOMI, no exophthalmos Musculoskeletal: no gross deformities, strength intact in all four extremities, no gross restriction of joint movements Skin:  no  rashes, no hyperemia Neurological: no tremor with outstretched hands  CMP ( most recent) CMP     Component Value Date/Time   NA 139 12/06/2023 0852   K 4.3 12/06/2023 0852   CL 101 12/06/2023 0852   CO2 23 12/06/2023 0852   GLUCOSE 97 12/06/2023 0852   GLUCOSE 95 10/13/2021 0354   BUN 15 12/06/2023 0852   CREATININE 0.87 12/06/2023 0852   CREATININE 0.70 09/16/2019 1507   CALCIUM 9.6 12/06/2023 0852   PROT 6.9 12/06/2023 0852   ALBUMIN 4.5 12/06/2023 0852   AST 18 12/06/2023 0852   ALT 12 12/06/2023 0852   ALKPHOS 129 (H) 12/06/2023 0852   BILITOT 0.5 12/06/2023 0852   EGFR 83 12/06/2023 0852   GFRNONAA >60 10/13/2021 0354   GFRNONAA 106 09/16/2019 1507     Diabetic Labs (most recent): Lab Results  Component Value Date   HGBA1C 5.2 12/04/2023   HGBA1C 5.5 05/18/2023   HGBA1C 5.1 04/16/2019     Lipid Panel ( most recent) Lipid Panel     Component Value Date/Time   CHOL 201 (H) 04/16/2019 1212   TRIG 50 05/18/2023 0000   HDL 42 (L) 04/16/2019 1212   CHOLHDL 4.8 04/16/2019 1212   VLDL 13 10/11/2016 0906   LDLCALC 104 05/18/2023 0000   LDLCALC 143 (H) 04/16/2019 1212      Lab Results  Component Value Date   TSH 1.51 05/18/2023   TSH 0.01 (L) 09/16/2019   TSH 0.19 (L) 04/16/2019   TSH 2.09 10/11/2016           Assessment & Plan:   1. Hypoglycemia (Primary)- reactive  - Wendy Watkins  is being seen at a kind request of Hyacinth Honey, NP. - I have reviewed her available records and clinically evaluated the patient.  - Based on these reviews, she has reactive hypoglycemia, however, there is not sufficient information to proceed with definitive treatment plan.  Will check insulin  and c-peptide levels as well as fasting glucose.  Her hypoglycemia  could be as a result of recent gastric bypass surgery (noninsulinoma pancreateogenous hypoglycemia-NIPH), where in some cases partial pancreatectomy may be required vs reactive hypoglycemia, vs pancreatic  insulinoma.  She is advised to continue using her CGM to help detect early hypoglycemia.  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  She notes no matter what she eats her glucose spikes and then suddenly drops right after.  We did talk about several pharmacological interventions to help, including Acarbose, oral medication taken before meals to slow the digestion of starches, and GLI/GIP therapy, used off label to help lower insulin  resistance and hypoglycemia as a result.  Ultimately, she decided to try Zepbound.  I did send in Zepbound 2.5 mg SQ weekly x 1 month, then increase to 5 mg SQ weekly thereafter.  She is aware that it may not be covered (definitely not covered by [facility] but could be covered under [employer] benefits).  She is possibly interested in self-pay option if not covered by insurance so she can have some peace as keeping up with her dropping glucose has been difficult to manage.  We did go over lifestyle changes to help prevent hypoglycemia.  The following Lifestyle Medicine recommendations according to American College of Lifestyle Medicine [facility]) were discussed and offered to patient and she agrees to start the journey:  A. Whole Foods, Plant-based plate comprising of fruits and vegetables, plant-based proteins, whole-grain carbohydrates was discussed in detail with the patient.   A list for source of those nutrients were also provided to the patient.  Patient will use only water  or unsweetened tea for hydration. B.  The need to stay away from risky substances including alcohol, smoking; obtaining 7 to 9 hours of restorative sleep, at least 150 minutes of moderate intensity exercise weekly, the importance of healthy social connections,  and stress reduction techniques were discussed. C.  A full color page of  Calorie density of various food groups per pound showing examples of each food groups was provided to the patient.   - she is advised to maintain close  follow up with Hyacinth Honey, NP for primary care needs.      I spent  27  minutes in the care of the patient today including review of labs from CMP, Lipids, Thyroid  Function, Hematology (current and previous including abstractions from other facilities); face-to-face time discussing  her blood glucose readings/logs, discussing hypoglycemia and hyperglycemia episodes and symptoms, medications doses, her options of short and long term treatment based on the latest standards of care / guidelines;  discussion about incorporating lifestyle medicine;  and documenting the encounter. Risk reduction counseling performed per USPSTF guidelines to reduce obesity and cardiovascular risk factors.     Please refer to Patient Instructions for Blood Glucose Monitoring and Insulin /Medications Dosing Guide  in media tab for additional information. Please  also refer to  Patient Self Inventory in the Media  tab for reviewed elements of pertinent patient history.  Wendy Watkins participated in the discussions, expressed understanding, and voiced agreement with the above plans.  All questions were answered to her satisfaction. she is encouraged to contact clinic should she have any questions or concerns prior to her return visit.  Follow up plan: Return in about 3 months (around 07/09/2024) for hypoglycemia follow up.   Wendy Rio, [facility] [address] [place], [place] [address] Phone: [phone] Fax: [phone]   04/08/2024, 4:38 PM

## 2024-04-09 ENCOUNTER — Other Ambulatory Visit (HOSPITAL_COMMUNITY): Payer: Self-pay

## 2024-04-10 ENCOUNTER — Encounter: Payer: Self-pay | Admitting: Nurse Practitioner

## 2024-04-10 NOTE — Telephone Encounter (Signed)
 Patient notes she needs PA for Zepbound.

## 2024-04-12 ENCOUNTER — Other Ambulatory Visit: Payer: Self-pay

## 2024-04-12 ENCOUNTER — Other Ambulatory Visit (HOSPITAL_COMMUNITY): Payer: Self-pay

## 2024-04-12 ENCOUNTER — Telehealth: Payer: Self-pay

## 2024-04-12 NOTE — Telephone Encounter (Signed)
 Pharmacy Patient Advocate Encounter   Received notification from Pt Calls Messages that prior authorization for zepbound is required/requested.   Insurance verification completed.   The patient is insured through [facility].   Per test claim: Per test claim, medication is not covered due to plan/benefit exclusion, PA not submitted at this time. Insurance does not cover weightloss medications

## 2024-04-15 NOTE — Telephone Encounter (Signed)
 Patient was called and a message was left sharing this information with her.

## 2024-04-15 NOTE — Telephone Encounter (Signed)
 Can you let the patient know that the PA was denied?  And, please ask if she wants to proceed with the self-pay option?

## 2024-04-16 ENCOUNTER — Telehealth: Payer: Self-pay | Admitting: *Deleted

## 2024-04-16 MED ORDER — TIRZEPATIDE-WEIGHT MANAGEMENT 2.5 MG/0.5ML ~~LOC~~ SOLN: 2.5000 mg | SUBCUTANEOUS | 0 refills | Status: DC

## 2024-04-16 MED ORDER — TIRZEPATIDE-WEIGHT MANAGEMENT 5 MG/0.5ML ~~LOC~~ SOLN: 5.0000 mg | SUBCUTANEOUS | 1 refills | Status: DC

## 2024-04-16 NOTE — Telephone Encounter (Signed)
 Noted

## 2024-04-16 NOTE — Telephone Encounter (Signed)
 Noted, she sent me a message as well.  It has already been taken care of  :)

## 2024-04-16 NOTE — Telephone Encounter (Signed)
 Talked with the patient. She would like to move forward and pay cash price for the Zepbound.

## 2024-05-06 ENCOUNTER — Other Ambulatory Visit (HOSPITAL_COMMUNITY): Payer: Self-pay

## 2024-05-07 ENCOUNTER — Other Ambulatory Visit: Payer: Self-pay | Admitting: Nurse Practitioner

## 2024-05-08 ENCOUNTER — Encounter: Payer: Self-pay | Admitting: Nurse Practitioner

## 2024-06-01 ENCOUNTER — Other Ambulatory Visit (HOSPITAL_COMMUNITY): Payer: Self-pay

## 2024-06-11 ENCOUNTER — Other Ambulatory Visit (HOSPITAL_COMMUNITY): Payer: Self-pay

## 2024-07-02 ENCOUNTER — Other Ambulatory Visit (HOSPITAL_COMMUNITY): Payer: Self-pay

## 2024-07-17 ENCOUNTER — Encounter: Payer: Self-pay | Admitting: Nurse Practitioner

## 2024-07-17 ENCOUNTER — Ambulatory Visit: Admitting: Nurse Practitioner

## 2024-07-17 VITALS — BP 108/68 | HR 76 | Ht 61.0 in | Wt 165.2 lb

## 2024-07-17 DIAGNOSIS — E162 Hypoglycemia, unspecified: Secondary | ICD-10-CM | POA: Diagnosis not present

## 2024-07-17 LAB — POCT GLYCOSYLATED HEMOGLOBIN (HGB A1C): Hemoglobin A1C: 5.2 % (ref 4.0–5.6)

## 2024-07-17 NOTE — Progress Notes (Signed)
 "          Endocrinology Follow Up Note                                            07/17/2024, 4:23 PM   Subjective:    Patient ID: Wendy Watkins, female    DOB: [birth year], PCP Hyacinth Honey, NP   Past Medical History:  Diagnosis Date   Chiari I malformation (HCC)    Eczema    GERD (gastroesophageal reflux disease)    Heavy menstrual period    History of iron deficiency anemia    Hyperlipidemia    Hypothyroidism    Obesity    PCOS (polycystic ovarian syndrome)    PMS (premenstrual syndrome) 10/11/2016   Urge incontinence    Vitamin D deficiency    Wears contact lenses    Wears glasses    Wears glasses    Past Surgical History:  Procedure Laterality Date   CALCANEAL OSTEOTOMY Left 12/05/2018   Procedure: EVANCALCANEAL OSTEOTOMY;  Surgeon: Gershon Donnice SAUNDERS, DPM;  Location: [facility];  Service: Podiatry;  Laterality: Left;   CALCANEAL OSTEOTOMY Right 05/08/2019   Procedure: EVAN CALCANEAL OSTEOTOMY;  Surgeon: Gershon Donnice SAUNDERS, DPM;  Location: [facility];  Service: Podiatry;  Laterality: Right;   COLONOSCOPY N/A 12/11/2023   Procedure: COLONOSCOPY;  Surgeon: Shaaron Lamar HERO, MD;  [facility];  Service: Endoscopy;  Laterality: N/A;  9:45 am, asa 2   FLAT FOOT RECONSTRUCTION-TAL GASTROC RECESSION Right 05/08/2019   Procedure: FLAT FOOT RECONSTRUCTION-TAL GASTROC RECESSION;  Surgeon: Gershon Donnice SAUNDERS, DPM;  Location: [facility];  Service: Podiatry;  Laterality: Right;   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/2021   Procedure: LAPAROSCOPIC ROUX-EN-Y GASTRIC BYPASS WITH UPPER ENDOSCOPY;  Surgeon: Signe Mitzie LABOR, MD;  Location: [facility];  Service: General;  Laterality: N/A;   GASTROC RECESSION EXTREMITY Left 12/05/2018   Procedure: GASTROC RECESSION EXTREMITY;  Surgeon: Gershon Donnice SAUNDERS, DPM;  Location: [facility];  Service: Podiatry;  Laterality: Left;   HIATAL HERNIA REPAIR N/A 10/11/2021   Procedure: HERNIA  REPAIR HIATAL;  Surgeon: Signe Mitzie LABOR, MD;  Location: [facility];  Service: General;  Laterality: N/A;   OSTECTOMY Left 12/05/2018   Procedure: COTTON OSTEOTOMY, MEDIAL CALCANEAL SLIDE OSTEOTOMY, MANIPULATION OF THE ANKLE UNDER ANESTHESIA;  Surgeon: Gershon Donnice SAUNDERS, DPM;  Location: [facility];  Service: Podiatry;  Laterality: Left;   STERIOD INJECTION Left 05/08/2019   Procedure: STEROID INJECTION;  Surgeon: Gershon Donnice SAUNDERS, DPM;  Location: [facility];  Service: Podiatry;  Laterality: Left;   TUBAL LIGATION  2003   UPPER GI ENDOSCOPY N/A 10/11/2021   Procedure: UPPER GI ENDOSCOPY;  Surgeon: Signe Mitzie LABOR, MD;  Location: [facility];  Service: General;  Laterality: N/A;   WISDOM TOOTH EXTRACTION     Social History   Socioeconomic History   Marital status: Married    Spouse name: Omar   Number of children: 3   Years of education: 14   Highest education Watkins: Not on file  Occupational History   Occupation: stay at home    Comment: husband is retired  Tobacco Use   Smoking status: Never   Smokeless tobacco: Never  Vaping Use   Vaping status: Never Used  Substance and Sexual Activity   Alcohol use: Yes    Comment:  socially wine   Drug use: No   Sexual activity: Yes    Birth control/protection: Surgical  Other Topics Concern   Not on file  Social History Narrative   Husband Omar - [employer] - Forensic Scientist retired with PTSD   3 children at home in [place] is in college- psychology   Tries to exercise   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   Social Drivers of Health   Tobacco Use: Low Risk (07/17/2024)   Patient History    Smoking Tobacco Use: Never    Smokeless Tobacco Use: Never    Passive Exposure: Not on file  Financial Resource Strain: Not on file  Food Insecurity: Not on file  Transportation Needs: Not on file  Physical Activity: Not on file  Stress: Not on file  Social Connections: Not on file  Depression (EYV7-0): Not on file  Alcohol Screen:  Not on file  Housing: Unknown (09/08/2023)   Received from [facility]   Epic    Unable to Pay for Housing in the Last Year: Not on file    Number of Times Moved in the Last Year: Not on file    At any time in the past 12 months, were you homeless or living in a shelter (including now)?: No  Utilities: Not on file  Health Literacy: Not on file   Family History  Problem Relation Age of Onset   Hyperlipidemia Mother    Hypertension Mother    Arthritis Father    Cancer Father        lymphoma   Heart disease Maternal Grandmother        CHF   Hyperlipidemia Maternal Grandmother    Hypertension Maternal Grandmother    Diabetes Maternal Grandmother    Cancer Paternal Grandmother        ovarian   Kidney disease Paternal Grandfather    Alcohol abuse Paternal Grandfather    Outpatient Encounter Medications as of 07/17/2024  Medication Sig   clobetasol  ointment (TEMOVATE ) 0.05 % APPLY OINTMENT TOPICALLY TO SKIN TWICE DAILY FOR 14 DAYS (Patient taking differently: Apply 1 application  topically daily as needed (eczema). Patient uses as needed)   Continuous Glucose Sensor (FREESTYLE LIBRE 3 PLUS SENSOR) MISC Use as directed.   levonorgestrel  (MIRENA ) 20 MCG/24HR IUD by Intrauterine route.   Semaglutide,0.25 or 0.5MG /DOS, (OZEMPIC, 0.25 OR 0.5 MG/DOSE,) 2 MG/1.5ML SOPN Inject 0.5 mg into the skin once a week.   [DISCONTINUED] tirzepatide  5 MG/0.5ML injection vial Inject 5 mg into the skin once a week.   [DISCONTINUED] ZEPBOUND  2.5 MG/0.5ML injection vial INJECT 0.5 ML (2.5 MG) UNDER THE SKIN ONCE WEEKLY (0.5ML= 50 UNITS) (Patient not taking: Reported on 07/17/2024)   No facility-administered encounter medications on file as of 07/17/2024.   ALLERGIES: Allergies  Allergen Reactions   Other Anaphylaxis    Peaches: throat swelling   Doxycycline Nausea Only    VACCINATION STATUS: Immunization History  Administered Date(s) Administered   Influenza-Unspecified 02/26/2019    PFIZER(Purple Top)SARS-COV-2 Vaccination 09/08/2019, 09/29/2019   Pfizer Covid-19 Vaccine Bivalent Booster 30yrs & up 11/27/2020   Pfizer Sars-cov-2 Pediatric Vaccine(70mos to <39yrs) 09/29/2019   Tetanus 04/16/2019    HPI YASHEKA FOSSETT is 49 y.o. female who presents today with a medical history as above. she is being seen in follow up after being seen in consultation for hypoglycemia requested by Hyacinth Honey, NP.  she has been dealing with symptoms of shakiness, sweatiness, mental fog for a few months now.  She notes her symptoms are worse after eating, although she does have some drops in glucose at night as well.  Her PCP did prescribe a CGM.  She notes she drinks mostly water , will have coffee with splenda and creamer in the mornings.  She eats pretty much what she wants at this stage, just in lower quantities.  She does maintain an active lifestyle, walking 45 minutes on average daily.  she has history of gastric bypass surgery in 2023.  She notes her dad did have lymphoma which resulted in him having a partial pancreatectomy and splenectomy in the past.  Review of systems  Constitutional: + Minimally fluctuating body weight,  current Body mass index is 31.21 kg/m. , no fatigue, no subjective hyperthermia, no subjective hypothermia Eyes: no blurry vision, no xerophthalmia ENT: no sore throat, no nodules palpated in throat, no dysphagia/odynophagia, no hoarseness Cardiovascular: no chest pain, no shortness of breath, no palpitations, no leg swelling Respiratory: no cough, no shortness of breath Gastrointestinal: no nausea/vomiting/diarrhea, + constipation Musculoskeletal: no muscle/joint aches Skin: no rashes, no hyperemia Neurological: no tremors, no numbness, no tingling, no dizziness Psychiatric: no depression, no anxiety  Objective:      BP Readings from Last 3 Encounters:  07/17/24 108/68  04/08/24 106/70  12/11/23 99/60     BP 108/68 (BP Location: Right Arm,  Patient Position: Sitting, Cuff Size: Large)   Pulse 76   Ht 5' 1 (1.549 m)   Wt 165 lb 3.2 oz (74.9 kg)   BMI 31.21 kg/m   Wt Readings from Last 3 Encounters:  07/17/24 165 lb 3.2 oz (74.9 kg)  04/08/24 178 lb 6.4 oz (80.9 kg)  12/04/23 176 lb 3.2 oz (79.9 kg)      Physical Exam- Limited  Constitutional:  Body mass index is 31.21 kg/m. , not in acute distress, normal state of mind Eyes:  EOMI, no exophthalmos Musculoskeletal: no gross deformities, strength intact in all four extremities, no gross restriction of joint movements Skin:  no rashes, no hyperemia Neurological: no tremor with outstretched hands  CMP ( most recent) CMP     Component Value Date/Time   NA 139 12/06/2023 0852   K 4.3 12/06/2023 0852   CL 101 12/06/2023 0852   CO2 23 12/06/2023 0852   GLUCOSE 97 12/06/2023 0852   GLUCOSE 95 10/13/2021 0354   BUN 15 12/06/2023 0852   CREATININE 0.87 12/06/2023 0852   CREATININE 0.70 09/16/2019 1507   CALCIUM 9.6 12/06/2023 0852   PROT 6.9 12/06/2023 0852   ALBUMIN 4.5 12/06/2023 0852   AST 18 12/06/2023 0852   ALT 12 12/06/2023 0852   ALKPHOS 129 (H) 12/06/2023 0852   BILITOT 0.5 12/06/2023 0852   EGFR 83 12/06/2023 0852   GFRNONAA >60 10/13/2021 0354   GFRNONAA 106 09/16/2019 1507     Diabetic Labs (most recent): Lab Results  Component Value Date   HGBA1C 5.2 07/17/2024   HGBA1C 5.2 12/04/2023   HGBA1C 5.5 05/18/2023     Lipid Panel ( most recent) Lipid Panel     Component Value Date/Time   CHOL 201 (H) 04/16/2019 1212   TRIG 50 05/18/2023 0000   HDL 42 (L) 04/16/2019 1212   CHOLHDL 4.8 04/16/2019 1212   VLDL 13 10/11/2016 0906   LDLCALC 104 05/18/2023 0000   LDLCALC 143 (H) 04/16/2019 1212      Lab Results  Component Value Date   TSH 1.51 05/18/2023   TSH 0.01 (L) 09/16/2019   TSH 0.19 (L)  04/16/2019   TSH 2.09 10/11/2016           Assessment & Plan:   1. Hypoglycemia (Primary)- reactive  - Wendy Watkins  is being seen  at a kind request of Hyacinth Honey, NP. - I have reviewed her available records and clinically evaluated the patient.  - Based on these reviews, she has reactive hypoglycemia, however, there is not sufficient information to proceed with definitive treatment plan.  Will check insulin  and c-peptide levels as well as fasting glucose.  Her hypoglycemia could be as a result of recent gastric bypass surgery (noninsulinoma pancreateogenous hypoglycemia-NIPH), where in some cases partial pancreatectomy may be required vs reactive hypoglycemia, vs pancreatic insulinoma.  She is advised to continue using her CGM to help detect early hypoglycemia.  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  She notes no matter what she eats her glucose spikes and then suddenly drops right after.  We did talk about several pharmacological interventions to help, including Acarbose, oral medication taken before meals to slow the digestion of starches, and GLI/GIP therapy, used off label to help lower insulin  resistance and hypoglycemia as a result.    Ultimately, she decided to try Zepbound .  I did send in Zepbound  2.5 mg SQ weekly x 1 month, then increase to 5 mg SQ weekly thereafter.  She did use the self pay option for a period of time and then switched to Ozempic 0.5 mg weekly (friend gave her some that she was unable to take due to side effects).  She has tolerated this well, has seen reduction in hypoglycemia as we are controlling the spikes in glucose more.  Her A1c today was 5.2%, unchanged from previous visit.  Went over strategies to overcome constipation today as well.  We did go over lifestyle changes to help prevent hypoglycemia.  The following Lifestyle Medicine recommendations according to American College of Lifestyle Medicine [facility]) were discussed and offered to patient and she agrees to start the journey:  A. Whole Foods, Plant-based plate comprising of fruits and vegetables, plant-based  proteins, whole-grain carbohydrates was discussed in detail with the patient.   A list for source of those nutrients were also provided to the patient.  Patient will use only water  or unsweetened tea for hydration. B.  The need to stay away from risky substances including alcohol, smoking; obtaining 7 to 9 hours of restorative sleep, at least 150 minutes of moderate intensity exercise weekly, the importance of healthy social connections,  and stress reduction techniques were discussed. C.  A full color page of  Calorie density of various food groups per pound showing examples of each food groups was provided to the patient.   - she is advised to maintain close follow up with Hyacinth Honey, NP for primary care needs.     I spent  23  minutes in the care of the patient today including review of labs from CMP, Lipids, Thyroid  Function, Hematology (current and previous including abstractions from other facilities); face-to-face time discussing  her blood glucose readings/logs, discussing hypoglycemia and hyperglycemia episodes and symptoms, medications doses, her options of short and long term treatment based on the latest standards of care / guidelines;  discussion about incorporating lifestyle medicine;  and documenting the encounter. Risk reduction counseling performed per USPSTF guidelines to reduce obesity and cardiovascular risk factors.     Please refer to Patient Instructions for Blood Glucose Monitoring and Insulin /Medications Dosing Guide  in media tab for additional information.  Please  also refer to  Patient Self Inventory in the Media  tab for reviewed elements of pertinent patient history.  Wendy Watkins participated in the discussions, expressed understanding, and voiced agreement with the above plans.  All questions were answered to her satisfaction. she is encouraged to contact clinic should she have any questions or concerns prior to her return visit.  Follow up plan: Return in  about 4 months (around 11/14/2024) for hypoglycemia follow up.   Wendy Watkins, [facility] [address] [place], [place] [address] Phone: [phone] Fax: [phone]   07/17/2024, 4:23 PM    "

## 2024-07-28 ENCOUNTER — Other Ambulatory Visit (HOSPITAL_COMMUNITY): Payer: Self-pay

## 2024-07-28 ENCOUNTER — Other Ambulatory Visit: Payer: Self-pay

## 2024-07-29 ENCOUNTER — Other Ambulatory Visit: Payer: Self-pay

## 2024-08-07 ENCOUNTER — Other Ambulatory Visit (HOSPITAL_COMMUNITY): Payer: Self-pay

## 2024-08-07 ENCOUNTER — Encounter (HOSPITAL_COMMUNITY): Payer: Self-pay

## 2024-08-08 ENCOUNTER — Other Ambulatory Visit: Payer: Self-pay

## 2024-11-19 ENCOUNTER — Ambulatory Visit: Admitting: Nurse Practitioner
